# Patient Record
Sex: Female | Born: 1990 | Race: White | Hispanic: No | Marital: Single | Smoking: Former smoker
Health system: Southern US, Community
[De-identification: ages and names within clinical notes are randomized; demographics above are authoritative.]

## PROBLEM LIST (undated history)

## (undated) ENCOUNTER — Inpatient Hospital Stay (HOSPITAL_COMMUNITY): Payer: Self-pay

## (undated) DIAGNOSIS — B009 Herpesviral infection, unspecified: Secondary | ICD-10-CM

## (undated) DIAGNOSIS — T7840XA Allergy, unspecified, initial encounter: Secondary | ICD-10-CM

## (undated) DIAGNOSIS — N83209 Unspecified ovarian cyst, unspecified side: Secondary | ICD-10-CM

## (undated) DIAGNOSIS — G43909 Migraine, unspecified, not intractable, without status migrainosus: Secondary | ICD-10-CM

## (undated) DIAGNOSIS — J302 Other seasonal allergic rhinitis: Secondary | ICD-10-CM

## (undated) DIAGNOSIS — B019 Varicella without complication: Secondary | ICD-10-CM

## (undated) DIAGNOSIS — N390 Urinary tract infection, site not specified: Secondary | ICD-10-CM

## (undated) DIAGNOSIS — D649 Anemia, unspecified: Secondary | ICD-10-CM

## (undated) DIAGNOSIS — F419 Anxiety disorder, unspecified: Secondary | ICD-10-CM

## (undated) HISTORY — DX: Migraine, unspecified, not intractable, without status migrainosus: G43.909

## (undated) HISTORY — DX: Other seasonal allergic rhinitis: J30.2

## (undated) HISTORY — PX: HERNIA REPAIR: SHX51

## (undated) HISTORY — DX: Allergy, unspecified, initial encounter: T78.40XA

## (undated) HISTORY — DX: Varicella without complication: B01.9

## (undated) HISTORY — DX: Anxiety disorder, unspecified: F41.9

## (undated) HISTORY — DX: Urinary tract infection, site not specified: N39.0

## (undated) HISTORY — DX: Herpesviral infection, unspecified: B00.9

## (undated) HISTORY — DX: Anemia, unspecified: D64.9

---
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
## 2013-06-02 ENCOUNTER — Ambulatory Visit (INDEPENDENT_AMBULATORY_CARE_PROVIDER_SITE_OTHER): Payer: BC Managed Care – PPO | Admitting: Family Medicine

## 2013-06-02 VITALS — BP 110/80 | HR 97 | Temp 98.7°F | Resp 20 | Ht 64.0 in | Wt 154.0 lb

## 2013-06-02 DIAGNOSIS — N912 Amenorrhea, unspecified: Secondary | ICD-10-CM

## 2013-06-02 DIAGNOSIS — R21 Rash and other nonspecific skin eruption: Secondary | ICD-10-CM

## 2013-06-02 DIAGNOSIS — R1011 Right upper quadrant pain: Secondary | ICD-10-CM

## 2013-06-02 DIAGNOSIS — N760 Acute vaginitis: Secondary | ICD-10-CM

## 2013-06-02 DIAGNOSIS — B9689 Other specified bacterial agents as the cause of diseases classified elsewhere: Secondary | ICD-10-CM

## 2013-06-02 DIAGNOSIS — N898 Other specified noninflammatory disorders of vagina: Secondary | ICD-10-CM

## 2013-06-02 DIAGNOSIS — B360 Pityriasis versicolor: Secondary | ICD-10-CM

## 2013-06-02 DIAGNOSIS — R197 Diarrhea, unspecified: Secondary | ICD-10-CM

## 2013-06-02 LAB — POCT URINALYSIS DIPSTICK
Glucose, UA: NEGATIVE
Ketones, UA: 80
Leukocytes, UA: NEGATIVE
Nitrite, UA: NEGATIVE
Protein, UA: 30
Spec Grav, UA: 1.025
Urobilinogen, UA: 0.2
pH, UA: 6.5

## 2013-06-02 LAB — POCT UA - MICROSCOPIC ONLY
Casts, Ur, LPF, POC: NEGATIVE
Crystals, Ur, HPF, POC: NEGATIVE
Yeast, UA: NEGATIVE

## 2013-06-02 LAB — POCT WET PREP WITH KOH

## 2013-06-02 LAB — COMPREHENSIVE METABOLIC PANEL
ALT: 15 U/L (ref 0–35)
Alkaline Phosphatase: 45 U/L (ref 39–117)
Glucose, Bld: 82 mg/dL (ref 70–99)
Sodium: 139 mEq/L (ref 135–145)
Total Bilirubin: 0.7 mg/dL (ref 0.3–1.2)
Total Protein: 7.1 g/dL (ref 6.0–8.3)

## 2013-06-02 LAB — POCT CBC
Lymph, poc: 1.7 (ref 0.6–3.4)
MCHC: 31.6 g/dL — AB (ref 31.8–35.4)
MPV: 9.6 fL (ref 0–99.8)
POC Granulocyte: 5.3 (ref 2–6.9)
POC LYMPH PERCENT: 23.2 %L (ref 10–50)
POC MID %: 4.6 %M (ref 0–12)
RDW, POC: 12.3 %

## 2013-06-02 LAB — POCT SKIN KOH: Skin KOH, POC: POSITIVE

## 2013-06-02 MED ORDER — KETOCONAZOLE 2 % EX CREA: TOPICAL_CREAM | Freq: Two times a day (BID) | CUTANEOUS | Status: DC

## 2013-06-02 MED ORDER — METRONIDAZOLE 500 MG PO TABS: 500.0000 mg | ORAL_TABLET | Freq: Two times a day (BID) | ORAL | Status: DC

## 2013-06-02 MED ORDER — ONDANSETRON 8 MG PO TBDP: 8.0000 mg | ORAL_TABLET | Freq: Three times a day (TID) | ORAL | Status: DC | PRN

## 2013-06-02 MED ORDER — SELENIUM SULFIDE 2.5 % EX LOTN: TOPICAL_LOTION | Freq: Every day | CUTANEOUS | Status: DC

## 2013-06-02 NOTE — Patient Instructions (Signed)
Tinea Versicolor Tinea versicolor is a common yeast infection of the skin. This condition becomes known when the yeast on our skin starts to overgrow (yeast is a normal inhabitant on our skin). This condition is noticed as white or light brown patches on brown skin, and is more evident in the summer on tanned skin. These areas are slightly scaly if scratched. The light patches from the yeast become evident when the yeast creates "holes in your suntan". This is most often noticed in the summer. The patches are usually located on the chest, back, pubis, neck and body folds. However, it may occur on any area of body. Mild itching and inflammation (redness or soreness) may be present. DIAGNOSIS  The diagnosisof this is made clinically (by looking). Cultures from samples are usually not needed. Examination under the microscope may help. However, yeast is normally found on skin. The diagnosis still remains clinical. Examination under Wood's Ultraviolet Light can determine the extent of the infection. TREATMENT  This common infection is usually only of cosmetic (only a concern to your appearance). It is easily treated with dandruff shampoo used during showers or bathing. Vigorous scrubbing will eliminate the yeast over several days time. The light areas in your skin may remain for weeks or months after the infection is cured unless your skin is exposed to sunlight. The lighter or darker spots caused by the fungus that remain after complete treatment are not a sign of treatment failure; it will take a long time to resolve. Your caregiver may recommend a number of commercial preparations or medication by mouth if home care is not working. Recurrence is common and preventative medication may be necessary. This skin condition is not highly contagious. Special care is not needed to protect close friends and family members. Normal hygiene is usually enough. Follow up is required only if you develop complications (such as a  secondary infection from scratching), if recommended by your caregiver, or if no relief is obtained from the preparations used. Document Released: 07/21/2000 Document Revised: 10/16/2011 Document Reviewed: 09/02/2008 [facility] Patient Information 2014 [place], [place].  Bacterial Vaginosis Bacterial vaginosis (BV) is a vaginal infection where the normal balance of bacteria in the vagina is disrupted. The normal balance is then replaced by an overgrowth of certain bacteria. There are several different kinds of bacteria that can cause BV. BV is the most common vaginal infection in women of childbearing age. CAUSES   The cause of BV is not fully understood. BV develops when there is an increase or imbalance of harmful bacteria.  Some activities or behaviors can upset the normal balance of bacteria in the vagina and put women at increased risk including:  Having a new sex partner or multiple sex partners.  Douching.  Using an intrauterine device (IUD) for contraception.  It is not clear what role sexual activity plays in the development of BV. However, women that have never had sexual intercourse are rarely infected with BV. Women do not get BV from toilet seats, bedding, swimming pools or from touching objects around them.  SYMPTOMS   Grey vaginal discharge.  A fish-like odor with discharge, especially after sexual intercourse.  Itching or burning of the vagina and vulva.  Burning or pain with urination.  Some women have no signs or symptoms at all. DIAGNOSIS  Your caregiver must examine the vagina for signs of BV. Your caregiver will perform lab tests and look at the sample of vaginal fluid through a microscope. They will look for bacteria and  abnormal cells (clue cells), a pH test higher than 4.5, and a positive amine test all associated with BV.  RISKS AND COMPLICATIONS   Pelvic inflammatory disease (PID).  Infections following gynecology surgery.  Developing HIV.  Developing  herpes virus. TREATMENT  Sometimes BV will clear up without treatment. However, all women with symptoms of BV should be treated to avoid complications, especially if gynecology surgery is planned. Female partners generally do not need to be treated. However, BV may spread between female sex partners so treatment is helpful in preventing a recurrence of BV.   BV may be treated with antibiotics. The antibiotics come in either pill or vaginal cream forms. Either can be used with nonpregnant or pregnant women, but the recommended dosages differ. These antibiotics are not harmful to the baby.  BV can recur after treatment. If this happens, a second round of antibiotics will often be prescribed.  Treatment is important for pregnant women. If not treated, BV can cause a premature delivery, especially for a pregnant woman who had a premature birth in the past. All pregnant women who have symptoms of BV should be checked and treated.  For chronic reoccurrence of BV, treatment with a type of prescribed gel vaginally twice a week is helpful. HOME CARE INSTRUCTIONS   Finish all medication as directed by your caregiver.  Do not have sex until treatment is completed.  Tell your sexual partner that you have a vaginal infection. They should see their caregiver and be treated if they have problems, such as a mild rash or itching.  Practice safe sex. Use condoms. Only have 1 sex partner. PREVENTION  Basic prevention steps can help reduce the risk of upsetting the natural balance of bacteria in the vagina and developing BV:  Do not have sexual intercourse (be abstinent).  Do not douche.  Use all of the medicine prescribed for treatment of BV, even if the signs and symptoms go away.  Tell your sex partner if you have BV. That way, they can be treated, if needed, to prevent reoccurrence. SEEK MEDICAL CARE IF:   Your symptoms are not improving after 3 days of treatment.  You have increased discharge, pain,  or fever. MAKE SURE YOU:   Understand these instructions.  Will watch your condition.  Will get help right away if you are not doing well or get worse. FOR MORE INFORMATION  Division of STD Prevention (DSTDP), Centers for Disease Control and Prevention: [employer]Apps.[employer] American Social Health Association (ASHA): www.ashastd.org  Document Released: 07/24/2005 Document Revised: 10/16/2011 Document Reviewed: 01/14/2009 [facility] Patient Information 2014 [place], [place].

## 2013-06-02 NOTE — Progress Notes (Signed)
Subjective:    Patient ID: Barbara Vargas, female    DOB: Jan 18, 1991, 22 y.o.   MRN: [id number]  Chief Complaint  Patient presents with  . Rash    chin and neck, started 2 weeks ago  . Diarrhea    with nausea and vomiting but pt denies any stomach pains, started yesterday, 2 episodes  . Vaginal Discharge   This chart was scribed for Norberto Sorenson, MD by Blanchard Kelch, ED Scribe. The patient was seen in room 9. Patient's care was started at 2:52 PM.   HPI  Barbara Vargas is a 22 y.o. female who presents to office complaining of an intermittent rash on her neck and upper chest that began about two weeks ago. It is scaly and a little itchy but seems to be spreading.  She believes that she may have a vaginal infection. She is also getting watery vaginal discharge. It has a foul odor that she described as smelling like "ammonia". She denies pelvic pain. She is sexually active. She uses condoms for birth control. She has been on oral contraceptives in the past but she ran out and never had it refilled. She is not interested in restarting on oral contraceptives at this time. She states that she would be ok if she became pregnant.  She had STD testing done recently and declines to have it repeated today.  She is also complaining of vomiting and diarrhea that began two days ago. She has been able to keep water down. The diarrhea is watery. There is no black or bloody diarrhea. The vomiting is just what she ate and is less frequent than the diarrhea.  She hasn't noticed a pattern to the diarrhea, such as after eating. There is no blood, bile or coffee ground looking vomit present, no melena or hematochezia.   She has mild abdominal pain due to the vomiting and diarrhea. She has been getting chills and dizziness with the other symptoms. She has not been using any OTC medication for the symptoms such as Advil, Tylenol, Pepto Bismol or Imodium. She denies suspicious food intake, recent travel, recent  illness or sick contacts with similar symptoms. She doesn't usually get indigestion or heart burn. She denies any alcohol use. She denies fever, back pain or changes in her urine.  She states that her mother has a past medical history of gall bladder problems.  Past Medical History  Diagnosis Date  . Anxiety   . Allergy    No current outpatient prescriptions on file prior to visit.   No current facility-administered medications on file prior to visit.   No Known Allergies   Review of Systems  Constitutional: Positive for chills. Negative for fever.  HENT: Negative for congestion and ear pain.   Eyes: Negative for pain.  Respiratory: Negative for cough and shortness of breath.   Cardiovascular: Negative for chest pain.  Gastrointestinal: Positive for nausea, vomiting, abdominal pain and diarrhea.  Genitourinary: Positive for vaginal discharge. Negative for dysuria, frequency, hematuria and pelvic pain.  Musculoskeletal: Negative for back pain and gait problem.  Skin: Positive for rash.  Neurological: Positive for dizziness. Negative for speech difficulty.  Psychiatric/Behavioral: Negative for confusion.      BP 110/80  Pulse 97  Temp(Src) 98.7 F (37.1 C) (Oral)  Resp 20  Ht 5\' 4"  (1.626 m)  Wt 154 lb (69.854 kg)  BMI 26.42 kg/m2  SpO2 99%  LMP 05/28/2013 Objective:   Physical Exam  Nursing note and vitals reviewed. Constitutional: She is  oriented to person, place, and time. She appears well-developed and well-nourished. No distress.  HENT:  Head: Normocephalic and atraumatic.  Eyes: EOM are normal.  Neck: Neck supple. No tracheal deviation present.  Cardiovascular: Normal rate, regular rhythm and normal heart sounds.   No murmur heard. Pulmonary/Chest: Effort normal and breath sounds normal. No respiratory distress. She has no wheezes. She has no rales.  Abdominal: Soft. Bowel sounds are normal. She exhibits no distension. There is tenderness in the right upper  quadrant. There is positive Murphy's sign.  Genitourinary: Uterus normal. There is no rash, tenderness or lesion on the right labia. There is no rash, tenderness or lesion on the left labia. Cervix exhibits discharge. Cervix exhibits no motion tenderness and no friability. Right adnexum displays no mass, no tenderness and no fullness. Left adnexum displays no mass, no tenderness and no fullness. No erythema, tenderness or bleeding around the vagina. Vaginal discharge found.  Moderate amount of white/brown thick discharge.  Musculoskeletal: Normal range of motion.  Neurological: She is alert and oriented to person, place, and time.  Skin: Skin is warm and dry. Rash noted.  Macular hypopigmented serpiginous rash with fine scale on left neck and left upper chest.   Psychiatric: She has a normal mood and affect. Her behavior is normal.   Results for orders placed in visit on 06/02/13  POCT URINALYSIS DIPSTICK      Result Value Range   Color, UA yellow     Clarity, UA cloudy     Glucose, UA neg     Bilirubin, UA small     Ketones, UA 80     Spec Grav, UA 1.025     Blood, UA moderate     pH, UA 6.5     Protein, UA 30     Urobilinogen, UA 0.2     Nitrite, UA neg     Leukocytes, UA Negative    POCT UA - MICROSCOPIC ONLY      Result Value Range   WBC, Ur, HPF, POC 1-5     RBC, urine, microscopic 0-1     Bacteria, U Microscopic 3+     Mucus, UA large     Epithelial cells, urine per micros tntc     Crystals, Ur, HPF, POC neg     Casts, Ur, LPF, POC neg     Yeast, UA neg    POCT SKIN KOH      Result Value Range   Skin KOH, POC Positive    POCT WET PREP WITH KOH      Result Value Range   Trichomonas, UA Negative     Clue Cells Wet Prep HPF POC 5-10     Epithelial Wet Prep HPF POC 5-15     Yeast Wet Prep HPF POC neg     Bacteria Wet Prep HPF POC 3+     RBC Wet Prep HPF POC 0-1     WBC Wet Prep HPF POC 15-tntc     KOH Prep POC Negative    POCT CBC      Result Value Range   WBC 7.4   4.6 - 10.2 K/uL   Lymph, poc 1.7  0.6 - 3.4   POC LYMPH PERCENT 23.2  10 - 50 %L   MID (cbc) 0.3  0 - 0.9   POC MID % 4.6  0 - 12 %M   POC Granulocyte 5.3  2 - 6.9   Granulocyte percent 72.2  37 - 80 %G  RBC 4.49  4.04 - 5.48 M/uL   Hemoglobin 13.5  12.2 - 16.2 g/dL   HCT, POC 16.1  09.6 - 47.9 %   MCV 95.0  80 - 97 fL   MCH, POC 30.1  27 - 31.2 pg   MCHC 31.6 (*) 31.8 - 35.4 g/dL   RDW, POC 04.5     Platelet Count, POC 245  142 - 424 K/uL   MPV 9.6  0 - 99.8 fL  POCT URINE PREGNANCY      Result Value Range   Preg Test, Ur Negative          Assessment & Plan:  3:42 PM- Discussed lab results and discharge plan. Will prescribe Flagyl, Zofran and topical ointment for yeast infection on chest and neck. Patient verbalizes understanding and agrees with treatment plan.  Vaginal discharge - Plan: POCT urinalysis dipstick, POCT UA - Microscopic Only, POCT Wet Prep with KOH  Rash - Plan: POCT Skin KOH  Diarrhea - Plan: POCT CBC, Comprehensive metabolic panel, US Abdomen Limited RUQ  Amenorrhea - Plan: POCT urine pregnancy  Bacterial vaginosis - flagyl  Tinea versicolor  Abdominal pain, right upper quadrant - Plan: US Abdomen Limited RUQ  Pt may have a self-limited gastroenteritis as a cause of her abd pain, n/v/d but due to increased tenderness over liver will order asap RUQ [place] to r/o gallstones. Reassured that acute cholecysitis unlikely due to nml CBC and non-toxic appearance on exam. Try prn zofran and push fluids.  Meds ordered this encounter  Medications  . metroNIDAZOLE (FLAGYL) 500 MG tablet    Sig: Take 1 tablet (500 mg total) by mouth 2 (two) times daily.    Dispense:  14 tablet    Refill:  0  . ketoconazole (NIZORAL) 2 % cream    Sig: Apply topically 2 (two) times daily.    Dispense:  60 g    Refill:  0  . selenium sulfide (SELSUN) 2.5 % shampoo    Sig: Apply topically daily.    Dispense:  120 mL    Refill:  0  . ondansetron (ZOFRAN-ODT) 8 MG disintegrating  tablet    Sig: Take 1 tablet (8 mg total) by mouth every 8 (eight) hours as needed for nausea.    Dispense:  30 tablet    Refill:  0    I personally performed the services described in this documentation, which was scribed in my presence. The recorded information has been reviewed and considered, and addended by me as needed.  Norberto Sorenson, MD MPH

## 2013-06-03 ENCOUNTER — Other Ambulatory Visit: Payer: Self-pay

## 2013-06-04 ENCOUNTER — Encounter: Payer: Self-pay | Admitting: Family Medicine

## 2013-07-11 ENCOUNTER — Ambulatory Visit
Admission: RE | Admit: 2013-07-11 | Discharge: 2013-07-11 | Disposition: A | Discharge status: Home / Self Care | Payer: BC Managed Care – PPO | Source: Ambulatory Visit | Attending: Family Medicine | Admitting: Family Medicine

## 2013-07-11 ENCOUNTER — Ambulatory Visit: Payer: BC Managed Care – PPO

## 2013-07-11 ENCOUNTER — Ambulatory Visit (INDEPENDENT_AMBULATORY_CARE_PROVIDER_SITE_OTHER): Payer: BC Managed Care – PPO | Admitting: Family Medicine

## 2013-07-11 VITALS — BP 124/74 | HR 76 | Temp 98.8°F | Resp 17 | Ht 64.0 in | Wt 158.0 lb

## 2013-07-11 DIAGNOSIS — R109 Unspecified abdominal pain: Secondary | ICD-10-CM

## 2013-07-11 DIAGNOSIS — R1011 Right upper quadrant pain: Secondary | ICD-10-CM

## 2013-07-11 DIAGNOSIS — N912 Amenorrhea, unspecified: Secondary | ICD-10-CM

## 2013-07-11 DIAGNOSIS — N390 Urinary tract infection, site not specified: Secondary | ICD-10-CM

## 2013-07-11 DIAGNOSIS — R197 Diarrhea, unspecified: Secondary | ICD-10-CM

## 2013-07-11 LAB — POCT CBC
HCT, POC: 44.6 % (ref 37.7–47.9)
Hemoglobin: 13.9 g/dL (ref 12.2–16.2)
Lymph, poc: 1.7 (ref 0.6–3.4)
MCH, POC: 29.6 pg (ref 27–31.2)
MCHC: 31.2 g/dL — AB (ref 31.8–35.4)
MCV: 95 fL (ref 80–97)
POC MID %: 5.8 %M (ref 0–12)
WBC: 5.5 10*3/uL (ref 4.6–10.2)

## 2013-07-11 LAB — POCT UA - MICROSCOPIC ONLY
Casts, Ur, LPF, POC: NEGATIVE
Mucus, UA: NEGATIVE

## 2013-07-11 LAB — POCT URINALYSIS DIPSTICK
Glucose, UA: NEGATIVE
Ketones, UA: NEGATIVE
Spec Grav, UA: 1.025
Urobilinogen, UA: 0.2

## 2013-07-11 LAB — POCT URINE PREGNANCY: Preg Test, Ur: NEGATIVE

## 2013-07-11 MED ORDER — NITROFURANTOIN MONOHYD MACRO 100 MG PO CAPS: 100.0000 mg | ORAL_CAPSULE | Freq: Two times a day (BID) | ORAL | Status: DC

## 2013-07-11 NOTE — Addendum Note (Signed)
Addended by: Eulah Pont on: 07/11/2013 11:43 AM   Modules accepted: Orders

## 2013-07-11 NOTE — Patient Instructions (Signed)
Drink plenty of fluids  Take MiraLax one dose daily until stools are loose, then drop back to just using a couple of times a week or as needed.  Take the antibiotic twice daily for the bladder infection  Tylenol or ibuprofen for pain  In the event of acutely worse pain come back for immediate recheck or go to the emergency room  If not improving over the next week or 2 after bowels are cleaned out return for further assessment

## 2013-07-11 NOTE — Progress Notes (Signed)
Subjective: 22 year old patient who was here about 6 weeks ago for several things, and was diagnosed with BV and a fungus on her chest wall. The fungus is gradually clearing up using some cream on it. She took metronidazole for the BV and that extend better. She's continued though with hurting in her low abdomen. He is just a soreness down there. She also has had a persistent nausea with some vomiting. Her last missed period was on October 31, so she is a few days late. They do use condoms consistently for contraception. She's not been running any fevers. Her bowels act regularly and although she previously had diarrhea she says they are soft but not diarrhea now.  Objective: Well-developed young lady in no major distress. Abdomen has active bowel sounds. Soft without masses. Mild nonspecific low abdominal tenderness. Review her abdominal ultrasound report which was done this morning. It looks negative. No CVA tenderness  Assessment Nausea and vomiting Low abdominal pain Late onset of menses  Plan: CBC, UA, urine hCG, KUB  Results for orders placed in visit on 07/11/13  POCT URINE PREGNANCY      Result Value Range   Preg Test, Ur Negative    POCT UA - MICROSCOPIC ONLY      Result Value Range   WBC, Ur, HPF, POC 4-8     RBC, urine, microscopic 0-1     Bacteria, U Microscopic 4+     Mucus, UA neg     Epithelial cells, urine per micros 7-12     Crystals, Ur, HPF, POC neg     Casts, Ur, LPF, POC neg     Yeast, UA neg    POCT URINALYSIS DIPSTICK      Result Value Range   Color, UA yellow     Clarity, UA clear     Glucose, UA neg     Bilirubin, UA neg     Ketones, UA neg     Spec Grav, UA 1.025     Blood, UA trace     pH, UA 6.5     Protein, UA neg     Urobilinogen, UA 0.2     Nitrite, UA neg     Leukocytes, UA Negative      [facility] reading (PRIMARY) by  Dr. Alwyn Ren Normal except for stool/gas  Has minimal pyuria, and will treat for a mild UTI. I do not believe this is because of  all of her discomfort. She does have a moderate amount of stool backed up in her, and this may well be the cause of nausea and pain. We will try some MiraLax first if symptoms continue to persist, will schedule for further testing and/or referral. May need a pelvic ultrasound, repeat pelvic exam, and GI referral..

## 2013-07-12 LAB — URINE CULTURE

## 2014-01-25 ENCOUNTER — Emergency Department (HOSPITAL_COMMUNITY)
Admission: EM | Admit: 2014-01-25 | Discharge: 2014-01-25 | Disposition: A | Discharge status: Home / Self Care | Payer: BC Managed Care – PPO | Attending: Emergency Medicine | Admitting: Emergency Medicine

## 2014-01-25 ENCOUNTER — Encounter (HOSPITAL_COMMUNITY): Payer: Self-pay | Admitting: Emergency Medicine

## 2014-01-25 DIAGNOSIS — O9989 Other specified diseases and conditions complicating pregnancy, childbirth and the puerperium: Secondary | ICD-10-CM | POA: Insufficient documentation

## 2014-01-25 DIAGNOSIS — R5381 Other malaise: Secondary | ICD-10-CM | POA: Insufficient documentation

## 2014-01-25 DIAGNOSIS — O210 Mild hyperemesis gravidarum: Secondary | ICD-10-CM | POA: Insufficient documentation

## 2014-01-25 DIAGNOSIS — R5383 Other fatigue: Secondary | ICD-10-CM

## 2014-01-25 DIAGNOSIS — N898 Other specified noninflammatory disorders of vagina: Secondary | ICD-10-CM | POA: Insufficient documentation

## 2014-01-25 DIAGNOSIS — R630 Anorexia: Secondary | ICD-10-CM | POA: Insufficient documentation

## 2014-01-25 DIAGNOSIS — Z8659 Personal history of other mental and behavioral disorders: Secondary | ICD-10-CM | POA: Insufficient documentation

## 2014-01-25 DIAGNOSIS — Z87891 Personal history of nicotine dependence: Secondary | ICD-10-CM | POA: Insufficient documentation

## 2014-01-25 DIAGNOSIS — R011 Cardiac murmur, unspecified: Secondary | ICD-10-CM | POA: Insufficient documentation

## 2014-01-25 DIAGNOSIS — Z3490 Encounter for supervision of normal pregnancy, unspecified, unspecified trimester: Secondary | ICD-10-CM

## 2014-01-25 DIAGNOSIS — R1110 Vomiting, unspecified: Secondary | ICD-10-CM

## 2014-01-25 LAB — URINALYSIS, ROUTINE W REFLEX MICROSCOPIC
Bilirubin Urine: NEGATIVE
Glucose, UA: NEGATIVE mg/dL
Hgb urine dipstick: NEGATIVE
KETONES UR: NEGATIVE mg/dL
LEUKOCYTES UA: NEGATIVE
NITRITE: NEGATIVE
PROTEIN: NEGATIVE mg/dL
Specific Gravity, Urine: 1.026 (ref 1.005–1.030)
UROBILINOGEN UA: 0.2 mg/dL (ref 0.0–1.0)
pH: 6 (ref 5.0–8.0)

## 2014-01-25 LAB — COMPREHENSIVE METABOLIC PANEL
ALK PHOS: 61 U/L (ref 39–117)
ALT: 13 U/L (ref 0–35)
AST: 18 U/L (ref 0–37)
Albumin: 4.3 g/dL (ref 3.5–5.2)
BUN: 13 mg/dL (ref 6–23)
CALCIUM: 9.4 mg/dL (ref 8.4–10.5)
CO2: 22 meq/L (ref 19–32)
Chloride: 103 mEq/L (ref 96–112)
Creatinine, Ser: 0.62 mg/dL (ref 0.50–1.10)
GLUCOSE: 80 mg/dL (ref 70–99)
POTASSIUM: 3.8 meq/L (ref 3.7–5.3)
SODIUM: 138 meq/L (ref 137–147)
Total Bilirubin: 0.6 mg/dL (ref 0.3–1.2)
Total Protein: 7.6 g/dL (ref 6.0–8.3)

## 2014-01-25 LAB — CBC WITH DIFFERENTIAL/PLATELET
Basophils Absolute: 0 10*3/uL (ref 0.0–0.1)
Basophils Relative: 0 % (ref 0–1)
EOS PCT: 2 % (ref 0–5)
Eosinophils Absolute: 0.2 10*3/uL (ref 0.0–0.7)
HCT: 39.3 % (ref 36.0–46.0)
HEMOGLOBIN: 13.4 g/dL (ref 12.0–15.0)
LYMPHS ABS: 2.1 10*3/uL (ref 0.7–4.0)
LYMPHS PCT: 25 % (ref 12–46)
MCH: 30 pg (ref 26.0–34.0)
MCHC: 34.1 g/dL (ref 30.0–36.0)
MCV: 88.1 fL (ref 78.0–100.0)
MONOS PCT: 8 % (ref 3–12)
Monocytes Absolute: 0.7 10*3/uL (ref 0.1–1.0)
Neutro Abs: 5.4 10*3/uL (ref 1.7–7.7)
Neutrophils Relative %: 65 % (ref 43–77)
PLATELETS: 222 10*3/uL (ref 150–400)
RBC: 4.46 MIL/uL (ref 3.87–5.11)
RDW: 12.1 % (ref 11.5–15.5)
WBC: 8.4 10*3/uL (ref 4.0–10.5)

## 2014-01-25 LAB — WET PREP, GENITAL
Clue Cells Wet Prep HPF POC: NONE SEEN
Trich, Wet Prep: NONE SEEN
WBC WET PREP: NONE SEEN
Yeast Wet Prep HPF POC: NONE SEEN

## 2014-01-25 LAB — PREGNANCY, URINE: PREG TEST UR: POSITIVE — AB

## 2014-01-25 LAB — LIPASE, BLOOD: Lipase: 23 U/L (ref 11–59)

## 2014-01-25 MED ORDER — ONDANSETRON 4 MG PO TBDP: 4.0000 mg | ORAL_TABLET | Freq: Three times a day (TID) | ORAL | Status: DC | PRN

## 2014-01-25 MED ORDER — ONDANSETRON HCL 4 MG/2ML IJ SOLN
4.0000 mg | Freq: Once | INTRAMUSCULAR | Status: AC
  Administered 2014-01-25: 4 mg via INTRAVENOUS
  Filled 2014-01-25: qty 2

## 2014-01-25 MED ORDER — SODIUM CHLORIDE 0.9 % IV BOLUS (SEPSIS)
1000.0000 mL | Freq: Once | INTRAVENOUS | Status: AC
  Administered 2014-01-25: 1000 mL via INTRAVENOUS

## 2014-01-25 NOTE — Discharge Instructions (Signed)
Zofran as needed for nausea and vomiting - as directed  Return to the emergency department if you develop any changing/worsening condition, abdominal pain, vaginal bleeding or any other concerns (please read additional information regarding your condition below)    Pregnancy If you are planning on getting pregnant, it is a good idea to make a preconception appointment with your health care provider to discuss having a healthy lifestyle before getting pregnant. This includes diet, weight, exercise, taking prenatal vitamins (especially folic acid, which helps prevent brain and spinal cord defects), avoiding alcohol, quitting smoking and illegal drugs, discussing medical problems (diabetes, heart disease, convulsions) and family history of genetic problems, your working conditions, and your immunizations. It is better to have knowledge of these things and do something about them before getting pregnant.  During your pregnancy, it is important to follow certain guidelines in order to have a healthy baby. It is very important to get good prenatal care and follow your health care provider's instructions. Prenatal care includes all the medical care you receive before your baby's birth. This helps to prevent problems during the pregnancy and childbirth.  HOME CARE INSTRUCTIONS   Start your prenatal visits by the 12th week of pregnancy or earlier, if possible. At first, appointments are usually scheduled monthly. They become more frequent in the last 2 months before delivery. It is important that you keep your health care provider's appointments and follow his or her instructions regarding medicine use, exercise, and diet.  During pregnancy, you are providing food for you and your baby. Eat a regular, well-balanced diet. Choose foods such as meat, fish, milk and other dairy products, vegetables, fruits, and whole-grain breads and cereals. Your health care provider will inform you of your ideal weight gain during  pregnancy depending on your current height and weight. Drink plenty of fluids. Try to drink 8 glasses of water a day.  Alcohol is related to a number of birth defects, including fetal alcohol syndrome. It is best to avoid alcohol completely. Smoking will cause low birth rate and prematurity. Use of alcohol and nicotine during your pregnancy also increases the chances that your child will be chemically dependent later in life and may contribute to sudden infant death syndrome, or SIDS.  Do not use illegal drugs.  Only take medicines as directed by your health care provider. Some medicines can cause genetic and physical problems in your baby.  Morning sickness can often be helped by keeping soda crackers at the bedside. Eat a few crackers before getting up in the morning.  A sexual relationship may be continued until near the end of your pregnancy if there are no other problems such as early (premature) leaking of amniotic fluid from the membranes, vaginal bleeding, painful intercourse, or abdominal pain.  Exercise regularly. Check with your health care provider if you are unsure about whether your exercises are safe.  Do not use hot tubs, steam rooms, or saunas. These increase the risk of fainting and you hurting yourself and your baby. Swimming is okay for exercise. Get plenty of rest, including afternoon naps when possible, especially in the third trimester.  Avoid toxic odors and chemicals.  Do not wear high heels. They may cause you to lose your balance and fall.  Do not lift over 5 pounds (2.3 kg). If you do lift anything, lift with your legs and thighs, not your back.  Avoid traveling, especially in the third trimester. If you have to travel out of the city or state, take  a copy of your medical records with you.  Learn about, and consider, breastfeeding your baby. SEEK IMMEDIATE MEDICAL CARE IF:   You have a fever.  You have leaking of fluid from your vagina. If you think your water  broke, take your temperature and tell your health care provider of this when you call.  You have vaginal spotting or bleeding. Tell your health care provider of the amount and how many pads are used.  You continue to feel nauseous and have no relief from remedies suggested, or you vomit blood or coffee ground-like materials.  You have upper abdominal pain.  You have round ligament discomfort in the lower abdominal area. This still must be evaluated by your health care provider.  You feel contractions.  You do not feel the baby move, or there is less movement than before.  You have painful urination.  You have abnormal vaginal discharge.  You have persistent diarrhea.  You have a severe headache.  You have problems with your vision.  You have muscle weakness.  You feel dizzy and faint.  You have shortness of breath.  You have chest pain.  You have back pain that travels down to your legs and feet.  You feel your heart is beating fast or not regular, like it skips a beat.  You gain a lot of weight in a short period of time (5 pounds in 3-5 days).  You are involved in a domestic violence situation. Document Released: 07/24/2005 Document Revised: 07/29/2013 Document Reviewed: 01/15/2009 [facility] Patient Information 2015 [place], [place]. This information is not intended to replace advice given to you by your health care provider. Make sure you discuss any questions you have with your health care provider.  Hyperemesis Gravidarum Hyperemesis gravidarum is a severe form of nausea and vomiting that happens during pregnancy. Hyperemesis is worse than morning sickness. It may cause you to have nausea or vomiting all day for many days. It may keep you from eating and drinking enough food and liquids. Hyperemesis usually occurs during the first half (the first 20 weeks) of pregnancy. It often goes away once a woman is in her second half of pregnancy. However, sometimes hyperemesis  continues through an entire pregnancy.  CAUSES  The cause of this condition is not completely known but is thought to be related to changes in the body's hormones when pregnant. It could be from the high level of the pregnancy hormone or an increase in estrogen in the body.  SIGNS AND SYMPTOMS   Severe nausea and vomiting.  Nausea that does not go away.  Vomiting that does not allow you to keep any food down.  Weight loss and body fluid loss (dehydration).  Having no desire to eat or not liking food you have previously enjoyed. DIAGNOSIS  Your health care provider will do a physical exam and ask you about your symptoms. He or she may also order blood tests and urine tests to make sure something else is not causing the problem.  TREATMENT  You may only need medicine to control the problem. If medicines do not control the nausea and vomiting, you will be treated in the hospital to prevent dehydration, increased acid in the blood (acidosis), weight loss, and changes in the electrolytes in your body that may harm the unborn baby (fetus). You may need IV fluids.  HOME CARE INSTRUCTIONS   Only take over-the-counter or prescription medicines as directed by your health care provider.  Try eating a couple of dry  crackers or toast in the morning before getting out of bed.  Avoid foods and smells that upset your stomach.  Avoid fatty and spicy foods.  Eat 5-6 small meals a day.  Do not drink when eating meals. Drink between meals.  For snacks, eat high-protein foods, such as cheese.  Eat or suck on things that have ginger in them. Ginger helps nausea.  Avoid food preparation. The smell of food can spoil your appetite.  Avoid iron pills and iron in your multivitamins until after 3-4 months of being pregnant. However, consult with your health care provider before stopping any prescribed iron pills. SEEK MEDICAL CARE IF:   Your abdominal pain increases.  You have a severe  headache.  You have vision problems.  You are losing weight. SEEK IMMEDIATE MEDICAL CARE IF:   You are unable to keep fluids down.  You vomit blood.  You have constant nausea and vomiting.  You have excessive weakness.  You have extreme thirst.  You have dizziness or fainting.  You have a fever or persistent symptoms for more than 2-3 days.  You have a fever and your symptoms suddenly get worse. MAKE SURE YOU:   Understand these instructions.  Will watch your condition.  Will get help right away if you are not doing well or get worse. Document Released: 07/24/2005 Document Revised: 05/14/2013 Document Reviewed: 03/05/2013 [facility] Patient Information 2015 [place]. This information is not intended to replace advice given to you by your health care provider. Make sure you discuss any questions you have with your health care provider.  Nausea and Vomiting Nausea is a sick feeling that often comes before throwing up (vomiting). Vomiting is a reflex where stomach contents come out of your mouth. Vomiting can cause severe loss of body fluids (dehydration). Children and elderly adults can become dehydrated quickly, especially if they also have diarrhea. Nausea and vomiting are symptoms of a condition or disease. It is important to find the cause of your symptoms. CAUSES   Direct irritation of the stomach lining. This irritation can result from increased acid production (gastroesophageal reflux disease), infection, food poisoning, taking certain medicines (such as nonsteroidal anti-inflammatory drugs), alcohol use, or tobacco use.  Signals from the brain.These signals could be caused by a headache, heat exposure, an inner ear disturbance, increased pressure in the brain from injury, infection, a tumor, or a concussion, pain, emotional stimulus, or metabolic problems.  An obstruction in the gastrointestinal tract (bowel obstruction).  Illnesses such as diabetes, hepatitis,  gallbladder problems, appendicitis, kidney problems, cancer, sepsis, atypical symptoms of a heart attack, or eating disorders.  Medical treatments such as chemotherapy and radiation.  Receiving medicine that makes you sleep (general anesthetic) during surgery. DIAGNOSIS Your caregiver may ask for tests to be done if the problems do not improve after a few days. Tests may also be done if symptoms are severe or if the reason for the nausea and vomiting is not clear. Tests may include:  Urine tests.  Blood tests.  Stool tests.  Cultures (to look for evidence of infection).  X-rays or other imaging studies. Test results can help your caregiver make decisions about treatment or the need for additional tests. TREATMENT You need to stay well hydrated. Drink frequently but in small amounts.You may wish to drink water, sports drinks, clear broth, or eat frozen ice pops or gelatin dessert to help stay hydrated.When you eat, eating slowly may help prevent nausea.There are also some antinausea medicines that may help prevent  nausea. HOME CARE INSTRUCTIONS   Take all medicine as directed by your caregiver.  If you do not have an appetite, do not force yourself to eat. However, you must continue to drink fluids.  If you have an appetite, eat a normal diet unless your caregiver tells you differently.  Eat a variety of complex carbohydrates (rice, wheat, potatoes, bread), lean meats, yogurt, fruits, and vegetables.  Avoid high-fat foods because they are more difficult to digest.  Drink enough water and fluids to keep your urine clear or pale yellow.  If you are dehydrated, ask your caregiver for specific rehydration instructions. Signs of dehydration may include:  Severe thirst.  Dry lips and mouth.  Dizziness.  Dark urine.  Decreasing urine frequency and amount.  Confusion.  Rapid breathing or pulse. SEEK IMMEDIATE MEDICAL CARE IF:   You have blood or brown flecks (like coffee  grounds) in your vomit.  You have black or bloody stools.  You have a severe headache or stiff neck.  You are confused.  You have severe abdominal pain.  You have chest pain or trouble breathing.  You do not urinate at least once every 8 hours.  You develop cold or clammy skin.  You continue to vomit for longer than 24 to 48 hours.  You have a fever. MAKE SURE YOU:   Understand these instructions.  Will watch your condition.  Will get help right away if you are not doing well or get worse. Document Released: 07/24/2005 Document Revised: 10/16/2011 Document Reviewed: 12/21/2010 [facility] Patient Information 2015 [place], [place]. This information is not intended to replace advice given to you by your health care provider. Make sure you discuss any questions you have with your health care provider.

## 2014-01-25 NOTE — ED Provider Notes (Signed)
CSN: [id number]     Arrival date & time 01/25/14  1721 History   First MD Initiated Contact with Patient 01/25/14 1810     Chief Complaint  Patient presents with  . Emesis  . Vaginal Discharge   HPI  Barbara Vargas is a 23 y.o. female with a PMH of allergies and anxiety who presents to the ED for evaluation of emesis and vaginal discharge. History was provided by the patient. Patient complains of a 3 days hx of constant nausea and vomiting. She has not been able to eat anything for the past 3 days. Also has had fatigue and generalized weakness. She denies any abdominal pain, diarrhea or constipation. She also complains of clear foul odor vaginal discharge for the past week. She denies any dysuria, vaginal bleeding, vaginal pain, pelvic pain, or genital sores. She is currently sexually active with no new partners. She is due for her menstrual period. Has breast tenderness. Does not use contraception. Denies any fever, chills, chest pain, SOB, headaches, dizziness, lightheadedness, or other concerns. No known sick contacts.    Past Medical History  Diagnosis Date  . Anxiety   . Allergy    Past Surgical History  Procedure Laterality Date  . Hernia repair     No family history on file. History  Substance Use Topics  . Smoking status: Former Smoker -- 0.50 packs/day for 1 years    Types: Cigarettes  . Smokeless tobacco: Not on file  . Alcohol Use: No   OB History   Grav Para Term Preterm Abortions TAB SAB Ect Mult Living                 Review of Systems  Constitutional: Positive for activity change, appetite change and fatigue. Negative for fever and chills.  HENT: Negative for congestion, rhinorrhea and sore throat.   Respiratory: Negative for cough and shortness of breath.   Cardiovascular: Negative for chest pain and leg swelling.  Gastrointestinal: Positive for nausea and vomiting. Negative for abdominal pain, diarrhea, constipation and blood in stool.  Genitourinary:  Positive for vaginal discharge. Negative for dysuria, frequency, hematuria, decreased urine volume, vaginal bleeding, difficulty urinating, genital sores, vaginal pain, menstrual problem and pelvic pain.  Musculoskeletal: Negative for back pain.  Skin: Negative for rash.  Neurological: Negative for dizziness, weakness, light-headedness and headaches.    Allergies  Review of patient's allergies indicates no known allergies.  Home Medications   Prior to Admission medications   Medication Sig Start Date End Date Taking? Authorizing Provider  ibuprofen (ADVIL,MOTRIN) 200 MG tablet Take 400 mg by mouth every 6 (six) hours as needed for moderate pain.   Yes Historical Provider, MD   BP 132/70  Pulse 99  Temp(Src) 98.3 F (36.8 C) (Oral)  Resp 20  SpO2 95%  LMP 12/24/2013  Filed Vitals:   01/25/14 1730 01/25/14 2030  BP: 132/70 109/67  Pulse: 99 86  Temp: 98.3 F (36.8 C)   TempSrc: Oral   Resp: 20 16  SpO2: 95% 100%    Physical Exam  Nursing note and vitals reviewed. Constitutional: She is oriented to person, place, and time. She appears well-developed and well-nourished. No distress.  Non-toxic  HENT:  Head: Normocephalic and atraumatic.  Right Ear: External ear normal.  Left Ear: External ear normal.  Nose: Nose normal.  Mouth/Throat: Oropharynx is clear and moist. No oropharyngeal exudate.  Eyes: Conjunctivae are normal. Right eye exhibits no discharge. Left eye exhibits no discharge.  Neck: Normal range of  motion. Neck supple.  Cardiovascular: Normal rate and regular rhythm.  Exam reveals no gallop and no friction rub.   Murmur heard. Grade 1 holosystolic murmur  Pulmonary/Chest: Effort normal and breath sounds normal. No respiratory distress. She has no wheezes. She has no rales. She exhibits no tenderness.  Abdominal: Soft. Bowel sounds are normal. She exhibits no distension and no mass. There is no tenderness. There is no rebound and no guarding.  Genitourinary:   Thin white minimal discharge present in the vaginal vault. No vaginal bleeding. No CMT or adnexal tenderness bilaterally. Os is closed.   Musculoskeletal: Normal range of motion. She exhibits no edema and no tenderness.  No CVA, lumbar, or flank tenderness bilaterally.   Neurological: She is alert and oriented to person, place, and time.  Skin: Skin is warm and dry. She is not diaphoretic.    ED Course  Procedures (including critical care time) Labs Review Labs Reviewed  WET PREP, GENITAL  GC/CHLAMYDIA PROBE AMP  PREGNANCY, URINE  URINALYSIS, ROUTINE W REFLEX MICROSCOPIC    Imaging Review No results found.   EKG Interpretation None      Results for orders placed during the hospital encounter of 01/25/14  WET PREP, GENITAL      Result Value Ref Range   Yeast Wet Prep HPF POC NONE SEEN  NONE SEEN   Trich, Wet Prep NONE SEEN  NONE SEEN   Clue Cells Wet Prep HPF POC NONE SEEN  NONE SEEN   WBC, Wet Prep HPF POC NONE SEEN  NONE SEEN  PREGNANCY, URINE      Result Value Ref Range   Preg Test, Ur POSITIVE (*) NEGATIVE  URINALYSIS, ROUTINE W REFLEX MICROSCOPIC      Result Value Ref Range   Color, Urine YELLOW  YELLOW   APPearance CLEAR  CLEAR   Specific Gravity, Urine 1.026  1.005 - 1.030   pH 6.0  5.0 - 8.0   Glucose, UA NEGATIVE  NEGATIVE mg/dL   Hgb urine dipstick NEGATIVE  NEGATIVE   Bilirubin Urine NEGATIVE  NEGATIVE   Ketones, ur NEGATIVE  NEGATIVE mg/dL   Protein, ur NEGATIVE  NEGATIVE mg/dL   Urobilinogen, UA 0.2  0.0 - 1.0 mg/dL   Nitrite NEGATIVE  NEGATIVE   Leukocytes, UA NEGATIVE  NEGATIVE  CBC WITH DIFFERENTIAL      Result Value Ref Range   WBC 8.4  4.0 - 10.5 K/uL   RBC 4.46  3.87 - 5.11 MIL/uL   Hemoglobin 13.4  12.0 - 15.0 g/dL   HCT 16.139.3  09.636.0 - 04.546.0 %   MCV 88.1  78.0 - 100.0 fL   MCH 30.0  26.0 - 34.0 pg   MCHC 34.1  30.0 - 36.0 g/dL   RDW 40.912.1  81.111.5 - 91.415.5 %   Platelets 222  150 - 400 K/uL   Neutrophils Relative % 65  43 - 77 %   Neutro Abs 5.4   1.7 - 7.7 K/uL   Lymphocytes Relative 25  12 - 46 %   Lymphs Abs 2.1  0.7 - 4.0 K/uL   Monocytes Relative 8  3 - 12 %   Monocytes Absolute 0.7  0.1 - 1.0 K/uL   Eosinophils Relative 2  0 - 5 %   Eosinophils Absolute 0.2  0.0 - 0.7 K/uL   Basophils Relative 0  0 - 1 %   Basophils Absolute 0.0  0.0 - 0.1 K/uL  COMPREHENSIVE METABOLIC PANEL      Result  Value Ref Range   Sodium 138  137 - 147 mEq/L   Potassium 3.8  3.7 - 5.3 mEq/L   Chloride 103  96 - 112 mEq/L   CO2 22  19 - 32 mEq/L   Glucose, Bld 80  70 - 99 mg/dL   BUN 13  6 - 23 mg/dL   Creatinine, Ser 1.61  0.50 - 1.10 mg/dL   Calcium 9.4  8.4 - 09.6 mg/dL   Total Protein 7.6  6.0 - 8.3 g/dL   Albumin 4.3  3.5 - 5.2 g/dL   AST 18  0 - 37 U/L   ALT 13  0 - 35 U/L   Alkaline Phosphatase 61  39 - 117 U/L   Total Bilirubin 0.6  0.3 - 1.2 mg/dL   GFR calc non Af Amer >90  >90 mL/min   GFR calc Af Amer >90  >90 mL/min  LIPASE, BLOOD      Result Value Ref Range   Lipase 23  11 - 59 U/L     MDM   Barbara Vargas is a 23 y.o. female with a PMH of allergies and anxiety who presents to the ED for evaluation of emesis and vaginal discharge. Patient found to have positive pregnancy test, which is likely the cause of her nausea and vomiting. No abdominal pain or vaginal bleeding. Pelvic exam unremarkable. Labs WNL. Abdominal exam benign. Patient able to tolerate PO challenge before discharge. Vital signs stable. Given referral to women's health. Return precautions, discharge instructions, and follow-up was discussed with the patient before discharge.     Discharge Medication List as of 01/25/2014  8:39 PM    START taking these medications   Details  ondansetron (ZOFRAN ODT) 4 MG disintegrating tablet Take 1 tablet (4 mg total) by mouth every 8 (eight) hours as needed for nausea or vomiting., Starting 01/25/2014, Until Discontinued, Print        Final impressions: 1. Pregnancy   2. Intractable vomiting with nausea, vomiting of  unspecified type   3. Vaginal discharge       Luiz Iron PA-C            Jillyn Ledger, PA-C 01/26/14 1739

## 2014-01-25 NOTE — ED Notes (Signed)
Pt presents with c/o vomiting, nausea, and vaginal discharge. Pt says that she has vomited approx 10-15 times over the last three days. Pt says that she has only been able to keep saltine crackers down. Pt also c/o vaginal discharge and pelvic pain. Pt has a hx of BV and think this may be the same thing.

## 2014-01-26 LAB — GC/CHLAMYDIA PROBE AMP
CT Probe RNA: NEGATIVE
GC Probe RNA: NEGATIVE

## 2014-01-27 NOTE — ED Provider Notes (Signed)
Medical screening examination/treatment/procedure(s) were performed by non-physician practitioner and as supervising physician I was immediately available for consultation/collaboration.   EKG Interpretation None        William Moustafa Mossa, MD 01/27/14 0703 

## 2014-02-24 LAB — OB RESULTS CONSOLE ANTIBODY SCREEN: Antibody Screen: NEGATIVE

## 2014-02-24 LAB — OB RESULTS CONSOLE ABO/RH: RH Type: POSITIVE

## 2014-02-24 LAB — OB RESULTS CONSOLE RPR: RPR: NONREACTIVE

## 2014-02-24 LAB — OB RESULTS CONSOLE RUBELLA ANTIBODY, IGM: Rubella: IMMUNE

## 2014-02-24 LAB — OB RESULTS CONSOLE HEPATITIS B SURFACE ANTIGEN: Hepatitis B Surface Ag: NEGATIVE

## 2014-02-24 LAB — OB RESULTS CONSOLE GC/CHLAMYDIA
CHLAMYDIA, DNA PROBE: NEGATIVE
Gonorrhea: NEGATIVE

## 2014-02-24 LAB — OB RESULTS CONSOLE HIV ANTIBODY (ROUTINE TESTING): HIV: NONREACTIVE

## 2014-04-10 ENCOUNTER — Encounter: Payer: Self-pay | Admitting: *Deleted

## 2014-04-14 ENCOUNTER — Ambulatory Visit: Payer: BC Managed Care – PPO | Admitting: Neurology

## 2014-05-13 ENCOUNTER — Ambulatory Visit: Payer: BC Managed Care – PPO | Admitting: Neurology

## 2014-07-01 ENCOUNTER — Inpatient Hospital Stay (HOSPITAL_COMMUNITY)
Admission: AD | Admit: 2014-07-01 | Discharge: 2014-07-01 | Disposition: A | Discharge status: Home / Self Care | Payer: BC Managed Care – PPO | Source: Ambulatory Visit | Attending: Obstetrics and Gynecology | Admitting: Obstetrics and Gynecology

## 2014-07-01 ENCOUNTER — Encounter (HOSPITAL_COMMUNITY): Payer: Self-pay

## 2014-07-01 DIAGNOSIS — R8789 Other abnormal findings in specimens from female genital organs: Secondary | ICD-10-CM

## 2014-07-01 DIAGNOSIS — Z87891 Personal history of nicotine dependence: Secondary | ICD-10-CM | POA: Insufficient documentation

## 2014-07-01 DIAGNOSIS — O09899 Supervision of other high risk pregnancies, unspecified trimester: Secondary | ICD-10-CM

## 2014-07-01 DIAGNOSIS — Z3A26 26 weeks gestation of pregnancy: Secondary | ICD-10-CM | POA: Diagnosis not present

## 2014-07-01 MED ORDER — BETAMETHASONE SOD PHOS & ACET 6 (3-3) MG/ML IJ SUSP
12.0000 mg | Freq: Once | INTRAMUSCULAR | Status: AC
  Administered 2014-07-01: 12 mg via INTRAMUSCULAR
  Filled 2014-07-01: qty 2

## 2014-07-01 NOTE — MAU Provider Note (Signed)
History   Continuation and completion of visit started by V. Standard, CNM Barbara Vargas is a 23 y.o. G1P0 at 26.6wks who presents from office for NR NST.  Report from V.Standard states that patient fFN positive. Patient reports seen if office with c/o vaginal leakage and discharge.  Also reporting some contractions and reports adequate hydration.  Patient denies issues with urination and bowel movements.  Patient reports active fetus and denies VB.    Patient Active Problem List   Diagnosis Date Noted  . RHINITIS 09/05/2010  . INSOMNIA-SLEEP DISORDER-UNSPEC 09/05/2010  . FATIGUE 09/05/2010  . HEADACHE 09/05/2010  . FREQUENCY, URINARY 09/05/2010    Chief Complaint  Patient presents with  . Contractions   HPI  OB History    Gravida Para Term Preterm AB TAB SAB Ectopic Multiple Living   1               Past Medical History  Diagnosis Date  . Anxiety   . Allergy   . Migraine   . Seasonal allergies   . Chicken pox     during childhood  . Herpes simplex     type 2 infection  . UTI (lower urinary tract infection)     Past Surgical History  Procedure Laterality Date  . Hernia repair      Family History  Problem Relation Age of Onset  . Other Maternal Aunt     malignant tumor of breast  . CVA Paternal Grandfather   . Diabetes type I Paternal Grandfather   . Heart disease Paternal Grandfather   . Other Maternal Grandmother     malignant tumor of lung  . Other Maternal Grandfather     malignant tumor of colon  . Hirschsprung's disease Other     paternal 1st cousin    History  Substance Use Topics  . Smoking status: Former Smoker -- 0.50 packs/day for 1 years    Types: Cigarettes  . Smokeless tobacco: Not on file  . Alcohol Use: No    Allergies: No Known Allergies  Prescriptions prior to admission  Medication Sig Dispense Refill Last Dose  . ibuprofen (ADVIL,MOTRIN) 200 MG tablet Take 400 mg by mouth every 6 (six) hours as needed for headache.    Past  Month at Unknown time  . Prenatal Vit-Fe Fumarate-FA (PRENATAL MULTIVITAMIN) TABS tablet Take 1 tablet by mouth at bedtime.   06/30/2014 at Unknown time  . ondansetron (ZOFRAN ODT) 4 MG disintegrating tablet Take 1 tablet (4 mg total) by mouth every 8 (eight) hours as needed for nausea or vomiting. (Patient not taking: Reported on 07/01/2014) 15 tablet 0 Not Taking at Unknown time    ROS  See HPI Above Physical Exam   Blood pressure 128/64, pulse 105, temperature 99.1 F (37.3 C), temperature source Oral, resp. rate 16, height 5' 3.5" (1.613 m), weight 171 lb 6.4 oz (77.747 kg), last menstrual period 12/24/2013, SpO2 99 %.  Physical Exam Deferred ED Course  Assessment: IUP at 26.6wks Cat I FT  Plan: -BMZ now -Return to MAU tomorrow for 2nd dose -Bleeding and PTL Precautions -Keep appt as scheduled: 07/13/2014 -Encouraged to call if any questions or concerns arise prior to next scheduled office visit.  -Discharged to home in stable condition  Barbara Vargas CNM, MSN 07/01/2014 8:03 PM

## 2014-07-01 NOTE — MAU Note (Signed)
Patient states she was seen in the office today for abdominal pain and a vaginal discharge. In the office patient was on the monitor and having irritability, vaginal discharge was negative and a fFn was sent from the office. Patient sent to MAU for further evaluation of preterm contractions. Patient states she continues to have abdominal pain, no bleeding or leaking. Reports good fetal movement.

## 2014-07-01 NOTE — MAU Provider Note (Signed)
Barbara FennelHannah Vargas is a 23 y.o. G1P0 at 26.6 weeks arrived to MAU from the office c/o "LEAKING/DISCHARGE X 2 DAYS AND CRAMPING. NO RECENT IC. STERILE SPEC NEGATIVE FERN, NITRAZENE, AND NO POOLING. FFN DONE AND SENT STAT, PICKED UP AT 3:40P. WET PREP + LEUKORRHEA. NST--NON-REACTIVE, WITH SCATTERED MILD VARIABLES, ONE MODERATE VARIABLE. UTERINE IRRITABILITY.    History     Patient Active Problem List   Diagnosis Date Noted  . RHINITIS 09/05/2010  . INSOMNIA-SLEEP DISORDER-UNSPEC 09/05/2010  . FATIGUE 09/05/2010  . HEADACHE 09/05/2010  . FREQUENCY, URINARY 09/05/2010    Chief Complaint  Patient presents with  . Contractions   HPI  OB History    Gravida Para Term Preterm AB TAB SAB Ectopic Multiple Living   1               Past Medical History  Diagnosis Date  . Anxiety   . Allergy   . Migraine   . Seasonal allergies   . Chicken pox     during childhood  . Herpes simplex     type 2 infection  . UTI (lower urinary tract infection)     Past Surgical History  Procedure Laterality Date  . Hernia repair      Family History  Problem Relation Age of Onset  . Other Maternal Aunt     malignant tumor of breast  . CVA Paternal Grandfather   . Diabetes type I Paternal Grandfather   . Heart disease Paternal Grandfather   . Other Maternal Grandmother     malignant tumor of lung  . Other Maternal Grandfather     malignant tumor of colon  . Hirschsprung's disease Other     paternal 1st cousin    History  Substance Use Topics  . Smoking status: Former Smoker -- 0.50 packs/day for 1 years    Types: Cigarettes  . Smokeless tobacco: Not on file  . Alcohol Use: No    Allergies: No Known Allergies  Prescriptions prior to admission  Medication Sig Dispense Refill Last Dose  . ibuprofen (ADVIL,MOTRIN) 200 MG tablet Take 400 mg by mouth every 6 (six) hours as needed for headache.    Past Month at Unknown time  . Prenatal Vit-Fe Fumarate-FA (PRENATAL MULTIVITAMIN) TABS  tablet Take 1 tablet by mouth at bedtime.   06/30/2014 at Unknown time  . ondansetron (ZOFRAN ODT) 4 MG disintegrating tablet Take 1 tablet (4 mg total) by mouth every 8 (eight) hours as needed for nausea or vomiting. (Patient not taking: Reported on 07/01/2014) 15 tablet 0 Not Taking at Unknown time    ROS See HPI above, all other systems are negative  Physical Exam   Blood pressure 128/64, pulse 105, temperature 99.1 F (37.3 C), temperature source Oral, resp. rate 16, height 5' 3.5" (1.613 m), weight 171 lb 6.4 oz (77.747 kg), last menstrual period 12/24/2013, SpO2 99 %.  Physical Exam Ext:  WNL ABD: Soft, non tender to palpation, no rebound or guarding SVE: deferred   ED Course  Assessment: IUP at  26.6 weeks Membranes: FHR: Category 1 CTX:  none   Plan:  2 hr observation    Breeanna Galgano, CNM, MSN 07/01/2014. 6:22 PM

## 2014-07-01 NOTE — Discharge Instructions (Signed)
Fetal Fibronectin This is a test done to help evaluate a pregnant woman's risk of pre-term delivery. It is generally done when you are 26 to [redacted] weeks pregnant and are having symptoms of premature labor. A Dacron swab is used to take a sample of cervical or vaginal fluid from the back portion of the vagina or from the area just outside the opening of the cervix. Fetal fibronectin (fFN) is a glycoprotein that can be used to help predict the short term risk of premature delivery. fFN is produced at the boundary between the amniotic sac and the lining of the mother's uterus. This is called the unteroplacental junction. Fetal fibronectin is largely confined to this junction and thought to help maintain the integrity of the boundary. fFN is normally detectable in cervicovaginal fluid during the first 20 to 24 weeks of pregnancy, and then is detectable again after about 36 weeks.  Finding fFN in cervicovaginal fluids after 36 weeks is not unusual as it is often released by the body as it gets ready for childbirth. The elevated fFN found in vaginal fluids early in pregnancy may simply reflect the normal growth and establishment of tissues at the unteroplacental junction with levels falling when this phase is complete. What is known is that fFN that is detected between 24 and 36 weeks of pregnancy is not normal. Elevated levels reflect a disturbance at the uteroplacental junction and have been associated with an increased risk of pre-term labor and delivery. Knowing whether or not a woman is likely to deliver prematurely helps your caregiver plan a course of action. The fFN test is a relatively non-invasive tool to help the caregiver to distinguish between those who are likely to deliver shortly and those who are not.  PREPARATION FOR TEST   Inform the person conducting the test if you have a medical condition or are using any medications that cause excessive bleeding.  Do not have sexual intercourse for 24 hours  before the procedure. NORMAL FINDINGS  Pregnancy = 50 nanograms/ml Ranges for normal findings may vary among different laboratories and hospitals. You should always check with your doctor after having lab work or other tests done to discuss the meaning of your test results and whether your values are considered within normal limits. MEANING OF TEST  Your caregiver will go over the test results with you and discuss the importance and meaning of your results, as well as treatment options and the need for additional tests if necessary. OBTAINING THE TEST RESULTS  It is your responsibility to obtain your test results. Ask the lab or department performing the test when and how you will get your results. Document Released: 05/25/2004 Document Revised: 10/16/2011 Document Reviewed: 10/20/2013 [facility] Patient Information 2015 [place], [place]. This information is not intended to replace advice given to you by your health care provider. Make sure you discuss any questions you have with your health care provider.

## 2014-07-02 ENCOUNTER — Inpatient Hospital Stay (HOSPITAL_COMMUNITY)
Admission: AD | Admit: 2014-07-02 | Discharge: 2014-07-02 | Disposition: A | Discharge status: Home / Self Care | Payer: BC Managed Care – PPO | Source: Ambulatory Visit | Attending: Obstetrics and Gynecology | Admitting: Obstetrics and Gynecology

## 2014-07-02 DIAGNOSIS — Z3A27 27 weeks gestation of pregnancy: Secondary | ICD-10-CM | POA: Insufficient documentation

## 2014-07-02 DIAGNOSIS — R8789 Other abnormal findings in specimens from female genital organs: Secondary | ICD-10-CM

## 2014-07-02 DIAGNOSIS — O09899 Supervision of other high risk pregnancies, unspecified trimester: Secondary | ICD-10-CM

## 2014-07-02 DIAGNOSIS — Z87891 Personal history of nicotine dependence: Secondary | ICD-10-CM | POA: Diagnosis not present

## 2014-07-02 DIAGNOSIS — O368120 Decreased fetal movements, second trimester, not applicable or unspecified: Secondary | ICD-10-CM | POA: Diagnosis not present

## 2014-07-02 MED ORDER — BETAMETHASONE SOD PHOS & ACET 6 (3-3) MG/ML IJ SUSP
12.0000 mg | Freq: Once | INTRAMUSCULAR | Status: AC
  Administered 2014-07-02: 12 mg via INTRAMUSCULAR
  Filled 2014-07-02: qty 2

## 2014-07-02 NOTE — MAU Provider Note (Signed)
History    Barbara FennelHannah Vargas is a 23 y.o. G1P0 at 27wks who returned for second dose of BMZ. During nurse assessment patient reporting decreased fetal movement.  Patient states fetus has been moving, but not as frequent as usual.  Patient denies other issues including LOF, VB, and contractions.    Patient Active Problem List   Diagnosis Date Noted  . RHINITIS 09/05/2010  . INSOMNIA-SLEEP DISORDER-UNSPEC 09/05/2010  . FATIGUE 09/05/2010  . HEADACHE 09/05/2010  . FREQUENCY, URINARY 09/05/2010    Chief Complaint  Patient presents with  . Injections    2nd BMZ shot   HPI  OB History    Gravida Para Term Preterm AB TAB SAB Ectopic Multiple Living   1               Past Medical History  Diagnosis Date  . Anxiety   . Allergy   . Migraine   . Seasonal allergies   . Chicken pox     during childhood  . Herpes simplex     type 2 infection  . UTI (lower urinary tract infection)     Past Surgical History  Procedure Laterality Date  . Hernia repair      Family History  Problem Relation Age of Onset  . Other Maternal Aunt     malignant tumor of breast  . CVA Paternal Grandfather   . Diabetes type I Paternal Grandfather   . Heart disease Paternal Grandfather   . Other Maternal Grandmother     malignant tumor of lung  . Other Maternal Grandfather     malignant tumor of colon  . Hirschsprung's disease Other     paternal 1st cousin    History  Substance Use Topics  . Smoking status: Former Smoker -- 0.50 packs/day for 1 years    Types: Cigarettes  . Smokeless tobacco: Not on file  . Alcohol Use: No    Allergies: No Known Allergies  Prescriptions prior to admission  Medication Sig Dispense Refill Last Dose  . acetaminophen (TYLENOL) 325 MG tablet Take 650 mg by mouth every 6 (six) hours as needed for headache.   Past Week at Unknown time  . Prenatal Vit-Fe Fumarate-FA (PRENATAL MULTIVITAMIN) TABS tablet Take 1 tablet by mouth at bedtime.   07/01/2014 at 2200     ROS  See HPI Above Physical Exam   Blood pressure 129/69, pulse 90, temperature 98.5 F (36.9 C), temperature source Oral, resp. rate 18, last menstrual period 12/24/2013, SpO2 100 %.  Physical Exam Deferred FHR:  150 bpm, Mod Var, + Variable Decels, +Accels --Mild variable decels noted UC: None graphed or palpated ED Course  Assessment: IUP at 27wks Cat II FT +fFN Decreased FM  Plan: -FHR reassuring -Cat II FT resolved with left tilt -PTL and Bleeding Precautions -Keep appt as scheduled: 07/13/2014 -Encouraged to call if any questions or concerns arise prior to next scheduled office visit.  -Discharged to home in stable condition  Barbara Vargas LYNN CNM, MSN 07/02/2014 9:51 PM

## 2014-07-02 NOTE — MAU Note (Signed)
No changes since last night. Here for 2nd betamethasone injection. Denies contractions/LOF/vaginal bleeding. Decreased fetal movement. States hasn't felt baby move since this morning.

## 2014-07-02 NOTE — Discharge Instructions (Signed)
Second Trimester of Pregnancy The second trimester is from week 13 through week 28, month 4 through 6. This is often the time in pregnancy that you feel your best. Often times, morning sickness has lessened or quit. You may have more energy, and you may get hungry more often. Your unborn baby (fetus) is growing rapidly. At the end of the sixth month, he or she is about 9 inches long and weighs about 1 pounds. You will likely feel the baby move (quickening) between 18 and 20 weeks of pregnancy. HOME CARE   Avoid all smoking, herbs, and alcohol. Avoid drugs not approved by your doctor.  Only take medicine as told by your doctor. Some medicines are safe and some are not during pregnancy.  Exercise only as told by your doctor. Stop exercising if you start having cramps.  Eat regular, healthy meals.  Wear a good support bra if your breasts are tender.  Do not use hot tubs, steam rooms, or saunas.  Wear your seat belt when driving.  Avoid raw meat, uncooked cheese, and liter boxes and soil used by cats.  Take your prenatal vitamins.  Try taking medicine that helps you poop (stool softener) as needed, and if your doctor approves. Eat more fiber by eating fresh fruit, vegetables, and whole grains. Drink enough fluids to keep your pee (urine) clear or pale yellow.  Take warm water baths (sitz baths) to soothe pain or discomfort caused by hemorrhoids. Use hemorrhoid cream if your doctor approves.  If you have puffy, bulging veins (varicose veins), wear support hose. Raise (elevate) your feet for 15 minutes, 3-4 times a day. Limit salt in your diet.  Avoid heavy lifting, wear low heals, and sit up straight.  Rest with your legs raised if you have leg cramps or low back pain.  Visit your dentist if you have not gone during your pregnancy. Use a soft toothbrush to brush your teeth. Be gentle when you floss.  You can have sex (intercourse) unless your doctor tells you not to.  Go to your  doctor visits. GET HELP IF:   You feel dizzy.  You have mild cramps or pressure in your lower belly (abdomen).  You have a nagging pain in your belly area.  You continue to feel sick to your stomach (nauseous), throw up (vomit), or have watery poop (diarrhea).  You have bad smelling fluid coming from your vagina.  You have pain with peeing (urination). GET HELP RIGHT AWAY IF:   You have a fever.  You are leaking fluid from your vagina.  You have spotting or bleeding from your vagina.  You have severe belly cramping or pain.  You lose or gain weight rapidly.  You have trouble catching your breath and have chest pain.  You notice sudden or extreme puffiness (swelling) of your face, hands, ankles, feet, or legs.  You have not felt the baby move in over an hour.  You have severe headaches that do not go away with medicine.  You have vision changes. Document Released: 10/18/2009 Document Revised: 11/18/2012 Document Reviewed: 09/24/2012 [employer] Patient Information 2015 [employer]. This information is not intended to replace advice given to you by your health care provider. Make sure you discuss any questions you have with your health care provider.  

## 2014-07-06 ENCOUNTER — Encounter (HOSPITAL_COMMUNITY): Payer: Self-pay

## 2014-07-06 ENCOUNTER — Inpatient Hospital Stay (HOSPITAL_COMMUNITY)
Admission: AD | Admit: 2014-07-06 | Discharge: 2014-07-06 | Disposition: A | Discharge status: Home / Self Care | Payer: BC Managed Care – PPO | Source: Ambulatory Visit | Attending: Obstetrics and Gynecology | Admitting: Obstetrics and Gynecology

## 2014-07-06 ENCOUNTER — Inpatient Hospital Stay (HOSPITAL_COMMUNITY): Payer: BC Managed Care – PPO

## 2014-07-06 DIAGNOSIS — Z87891 Personal history of nicotine dependence: Secondary | ICD-10-CM | POA: Diagnosis not present

## 2014-07-06 DIAGNOSIS — Z3A27 27 weeks gestation of pregnancy: Secondary | ICD-10-CM | POA: Insufficient documentation

## 2014-07-06 DIAGNOSIS — A6000 Herpesviral infection of urogenital system, unspecified: Secondary | ICD-10-CM | POA: Diagnosis not present

## 2014-07-06 DIAGNOSIS — R1013 Epigastric pain: Secondary | ICD-10-CM | POA: Diagnosis present

## 2014-07-06 DIAGNOSIS — O9989 Other specified diseases and conditions complicating pregnancy, childbirth and the puerperium: Secondary | ICD-10-CM | POA: Diagnosis not present

## 2014-07-06 DIAGNOSIS — G43909 Migraine, unspecified, not intractable, without status migrainosus: Secondary | ICD-10-CM | POA: Diagnosis not present

## 2014-07-06 DIAGNOSIS — R1011 Right upper quadrant pain: Secondary | ICD-10-CM | POA: Insufficient documentation

## 2014-07-06 LAB — CBC
HCT: 33.5 % — ABNORMAL LOW (ref 36.0–46.0)
Hemoglobin: 11.3 g/dL — ABNORMAL LOW (ref 12.0–15.0)
MCH: 30.1 pg (ref 26.0–34.0)
MCHC: 33.7 g/dL (ref 30.0–36.0)
MCV: 89.1 fL (ref 78.0–100.0)
Platelets: 184 10*3/uL (ref 150–400)
RBC: 3.76 MIL/uL — ABNORMAL LOW (ref 3.87–5.11)
RDW: 12.6 % (ref 11.5–15.5)
WBC: 11.4 10*3/uL — ABNORMAL HIGH (ref 4.0–10.5)

## 2014-07-06 LAB — COMPREHENSIVE METABOLIC PANEL
ALBUMIN: 3.1 g/dL — AB (ref 3.5–5.2)
ALT: 14 U/L (ref 0–35)
AST: 15 U/L (ref 0–37)
Alkaline Phosphatase: 66 U/L (ref 39–117)
Anion gap: 13 (ref 5–15)
BILIRUBIN TOTAL: 0.2 mg/dL — AB (ref 0.3–1.2)
BUN: 7 mg/dL (ref 6–23)
CHLORIDE: 100 meq/L (ref 96–112)
CO2: 23 mEq/L (ref 19–32)
CREATININE: 0.43 mg/dL — AB (ref 0.50–1.10)
Calcium: 9.7 mg/dL (ref 8.4–10.5)
GFR calc Af Amer: >90 mL/min (ref >90)
GFR calc non Af Amer: >90 mL/min (ref >90)
Glucose, Bld: 76 mg/dL (ref 70–99)
POTASSIUM: 4 meq/L (ref 3.7–5.3)
Sodium: 136 mEq/L — ABNORMAL LOW (ref 137–147)
Total Protein: 6.7 g/dL (ref 6.0–8.3)

## 2014-07-06 LAB — URINALYSIS, ROUTINE W REFLEX MICROSCOPIC
Bilirubin Urine: NEGATIVE
Glucose, UA: NEGATIVE mg/dL
Hgb urine dipstick: NEGATIVE
Ketones, ur: NEGATIVE mg/dL
Nitrite: NEGATIVE
PH: 7 (ref 5.0–8.0)
Protein, ur: NEGATIVE mg/dL
Specific Gravity, Urine: 1.015 (ref 1.005–1.030)
Urobilinogen, UA: 0.2 mg/dL (ref 0.0–1.0)

## 2014-07-06 LAB — LIPASE, BLOOD: LIPASE: 17 U/L (ref 11–59)

## 2014-07-06 LAB — URIC ACID: Uric Acid, Serum: 4.3 mg/dL (ref 2.4–7.0)

## 2014-07-06 LAB — URINE MICROSCOPIC-ADD ON

## 2014-07-06 LAB — AMYLASE: Amylase: 37 U/L (ref 0–105)

## 2014-07-06 LAB — LACTATE DEHYDROGENASE: LDH: 114 U/L (ref 94–250)

## 2014-07-06 MED ORDER — LACTATED RINGERS IV SOLN
INTRAVENOUS | Status: DC
  Administered 2014-07-06: 15:00:00 via INTRAVENOUS

## 2014-07-06 MED ORDER — ONDANSETRON HCL 4 MG/2ML IJ SOLN
4.0000 mg | Freq: Once | INTRAMUSCULAR | Status: AC
  Administered 2014-07-06: 4 mg via INTRAVENOUS
  Filled 2014-07-06: qty 2

## 2014-07-06 MED ORDER — BUTORPHANOL TARTRATE 1 MG/ML IJ SOLN
1.0000 mg | INTRAMUSCULAR | Status: DC | PRN
  Administered 2014-07-06: 1 mg via INTRAVENOUS
  Filled 2014-07-06: qty 1

## 2014-07-06 MED ORDER — HYDROCODONE-ACETAMINOPHEN 5-325 MG PO TABS: 1.0000 | ORAL_TABLET | ORAL | Status: DC | PRN

## 2014-07-06 MED ORDER — HYDROMORPHONE HCL 1 MG/ML IJ SOLN
1.0000 mg | INTRAMUSCULAR | Status: DC | PRN
  Administered 2014-07-06: 1 mg via INTRAVENOUS
  Filled 2014-07-06: qty 1

## 2014-07-06 MED ORDER — LACTATED RINGERS IV BOLUS (SEPSIS)
500.0000 mL | Freq: Once | INTRAVENOUS | Status: AC
  Administered 2014-07-06: 500 mL via INTRAVENOUS

## 2014-07-06 MED ORDER — HYDROCODONE-ACETAMINOPHEN 5-325 MG PO TABS
1.0000 | ORAL_TABLET | ORAL | Status: DC | PRN
  Administered 2014-07-06: 1 via ORAL
  Filled 2014-07-06: qty 1

## 2014-07-06 MED ORDER — PANTOPRAZOLE SODIUM 40 MG IV SOLR
40.0000 mg | Freq: Once | INTRAVENOUS | Status: AC
  Administered 2014-07-06: 40 mg via INTRAVENOUS
  Filled 2014-07-06: qty 40

## 2014-07-06 NOTE — Progress Notes (Signed)
Lanelle BalLLamon, RN answering and Animatorrelaying info to [place], [place]CNM. Pt here for upper abd pain and nausea, no ctx's, efm tracing wnl. U/A pending.

## 2014-07-06 NOTE — MAU Note (Signed)
Patient became nauseated and diaphoretic in triage. Assisted to the bathroom and into a room.

## 2014-07-06 NOTE — MAU Provider Note (Signed)
History   23 yo G 1P0 at 3127 4/7 weeks presented after calling office with mid-abdominal pain and nausea.  No vomiting.  Denied leaking or bleeding, "felt different than contractions" noted last week.  Reports +FM.  No dysuria or frequency.  No known viral exposures.  No constipation or diarrhea.  Last ate at noon--cereal.    Patient Active Problem List   Diagnosis Date Noted  . Abdominal pain, epigastric 07/06/2014  . Migraines 07/06/2014  . 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 Preterm labor--+ FFN 11/25, received betamethasone course 07/06/2014    Chief Complaint  Patient presents with  . Abdominal Pain   HPI:  See above  OB History    Gravida Para Term Preterm AB TAB SAB Ectopic Multiple Living   1               Past Medical History  Diagnosis Date  . Anxiety   . Allergy   . Migraine   . Seasonal allergies   . Chicken pox     during childhood  . Herpes simplex     type 2 infection  . UTI (lower urinary tract infection)   . Preterm labor     Past Surgical History  Procedure Laterality Date  . Hernia repair      Family History  Problem Relation Age of Onset  . Other Maternal Aunt     malignant tumor of breast  . CVA Paternal Grandfather   . Diabetes type I Paternal Grandfather   . Heart disease Paternal Grandfather   . Other Maternal Grandmother     malignant tumor of lung  . Other Maternal Grandfather     malignant tumor of colon  . Hirschsprung's disease Other     paternal 1st cousin    History  Substance Use Topics  . Smoking status: Former Smoker -- 0.50 packs/day for 1 years    Types: Cigarettes  . Smokeless tobacco: Never Used  . Alcohol Use: No    Allergies: No Known Allergies  Prescriptions prior to admission  Medication Sig Dispense Refill Last Dose  . acetaminophen (TYLENOL) 325 MG tablet Take 325 mg by mouth every 6 (six) hours as needed for headache.    Past Month at Unknown time  . GuaiFENesin (MUCINEX PO) Take 1 tablet by mouth daily.    07/05/2014 at Unknown time  . Prenatal Vit-Fe Fumarate-FA (PRENATAL MULTIVITAMIN) TABS tablet Take 1 tablet by mouth at bedtime.   07/05/2014 at Unknown time    ROS:  Upper abdominal pain, nausea, +FM Physical Exam   Blood pressure 137/73, pulse 109, temperature 98.7 F (37.1 C), resp. rate 18, last menstrual period 12/24/2013, SpO2 100 %.  Physical Exam  Appears uncomfortable, but not in severe pain. Chest clear Heart RRR without murmur Abd soft, mild tenderness in right midline and RUQ.  No rebound or guarding. Pelvic--cervix closed, long, firm, pp OOP.   Uterus soft, NT, no contractions palpated. Ext WNL  FHR reassuring for EGA No UCs  ED Course  Assessment: IUP at 27 4/7 weeks Upper abdominal pain Hx +FFN, betamethasone course 11/25-11/26.  Plan: CBC, CMP, LDH, uric acid, amylase, lipase IV hydration Protonix Zofran Pain med prn. Abdominal US at 6pm. Dr. Stefano GaulStringer updated.    Nigel BridgemanLATHAM, Grizel Vesely CNM, MSN 07/06/2014 3:35 PM   Addendum: Returned from abdominal US:  All WNL.  Results for orders placed or performed during the hospital encounter of 07/06/14 (from the past 24 hour(s))  Urinalysis, Routine w reflex microscopic  Status: Abnormal   Collection Time: 07/06/14  1:00 PM  Result Value Ref Range   Color, Urine YELLOW YELLOW   APPearance HAZY (A) CLEAR   Specific Gravity, Urine 1.015 1.005 - 1.030   pH 7.0 5.0 - 8.0   Glucose, UA NEGATIVE NEGATIVE mg/dL   Hgb urine dipstick NEGATIVE NEGATIVE   Bilirubin Urine NEGATIVE NEGATIVE   Ketones, ur NEGATIVE NEGATIVE mg/dL   Protein, ur NEGATIVE NEGATIVE mg/dL   Urobilinogen, UA 0.2 0.0 - 1.0 mg/dL   Nitrite NEGATIVE NEGATIVE   Leukocytes, UA SMALL (A) NEGATIVE  Urine microscopic-add on     Status: Abnormal   Collection Time: 07/06/14  1:00 PM  Result Value Ref Range   Squamous Epithelial / LPF FEW (A) RARE   WBC, UA 3-6 <3 WBC/hpf   Bacteria, UA FEW (A) RARE  CBC     Status: Abnormal   Collection Time:  07/06/14  2:19 PM  Result Value Ref Range   WBC 11.4 (H) 4.0 - 10.5 K/uL   RBC 3.76 (L) 3.87 - 5.11 MIL/uL   Hemoglobin 11.3 (L) 12.0 - 15.0 g/dL   HCT 09.833.5 (L) 11.936.0 - 14.746.0 %   MCV 89.1 78.0 - 100.0 fL   MCH 30.1 26.0 - 34.0 pg   MCHC 33.7 30.0 - 36.0 g/dL   RDW 82.912.6 56.211.5 - 13.015.5 %   Platelets 184 150 - 400 K/uL  Comprehensive metabolic panel     Status: Abnormal   Collection Time: 07/06/14  2:19 PM  Result Value Ref Range   Sodium 136 (L) 137 - 147 mEq/L   Potassium 4.0 3.7 - 5.3 mEq/L   Chloride 100 96 - 112 mEq/L   CO2 23 19 - 32 mEq/L   Glucose, Bld 76 70 - 99 mg/dL   BUN 7 6 - 23 mg/dL   Creatinine, Ser 8.650.43 (L) 0.50 - 1.10 mg/dL   Calcium 9.7 8.4 - 78.410.5 mg/dL   Total Protein 6.7 6.0 - 8.3 g/dL   Albumin 3.1 (L) 3.5 - 5.2 g/dL   AST 15 0 - 37 U/L   ALT 14 0 - 35 U/L   Alkaline Phosphatase 66 39 - 117 U/L   Total Bilirubin 0.2 (L) 0.3 - 1.2 mg/dL   GFR calc non Af Amer >90 >90 mL/min   GFR calc Af Amer >90 >90 mL/min   Anion gap 13 5 - 15  Lactate dehydrogenase     Status: None   Collection Time: 07/06/14  2:19 PM  Result Value Ref Range   LDH 114 94 - 250 U/L  Uric acid     Status: None   Collection Time: 07/06/14  2:19 PM  Result Value Ref Range   Uric Acid, Serum 4.3 2.4 - 7.0 mg/dL  Amylase     Status: None   Collection Time: 07/06/14  2:19 PM  Result Value Ref Range   Amylase 37 0 - 105 U/L  Lipase, blood     Status: None   Collection Time: 07/06/14  2:19 PM  Result Value Ref Range   Lipase 17 11 - 59 U/L    Received Dilaudid x 1 dose with some benefit, then Stadol 1 mg with same degree of benefit. No vomiting. Pain still present, now more in RLQ, but no guarding or rebound.   No evidence of surgical/acute abdomen. Patient hungry.  Consulted with Dr. Stefano GaulStringer. D/C home with abdominal pain precautions. Rx Vicodin, with 1 tab given before d/c. Notes for work absences  for last Thursday and Friday, and for today and tomorrow. Patient to f/u with [facility]  with any worsening of sx. I will have office call patient tomorrow to check on status, and plan recheck in the office later this week.  Nigel Bridgeman, CNM 07/06/14 7:40p

## 2014-07-06 NOTE — Progress Notes (Signed)
Called MAU, notified of pt's c/o nausea, no vomiting. Upper abdominal pain, no u/c's noted. Orders obtained and will come see pt shortly.

## 2014-07-06 NOTE — Discharge Instructions (Signed)

## 2014-07-06 NOTE — MAU Note (Signed)
Patient states she has been having abdominal pain since last night, did come and go but now constant. Denies vomiting but does have a little nausea, no bleeding or leaking. Reports fetal movement but took Musinex last night and has not felt as much movement.

## 2014-07-06 NOTE — MAU Note (Signed)
No s/s of allergic reaction or sensitivity to pain medication noted. Patient states that pain is still a 6of10.

## 2014-07-06 NOTE — Progress Notes (Signed)
Work note provided per pt's request.   Sherre ScarletKimberly Joanna Vargas, CNM 07/06/14, 8:05 PM

## 2014-07-06 NOTE — MAU Note (Signed)
Pt states here for constant upper abdominal pain since yesterday. Does not feel like contractions. Denies bleeding or abnormal vaginal discharge. Has been constantly nauseated however no vomiting or diarrhea.

## 2014-07-06 NOTE — Progress Notes (Signed)
Notified that pt now feels pain more in rlq. Tender to touch. Stadol helped somewhat. Awaiting u/s and results.

## 2014-08-07 ENCOUNTER — Inpatient Hospital Stay (HOSPITAL_COMMUNITY)
Admission: AD | Admit: 2014-08-07 | Discharge: 2014-08-07 | Disposition: A | Discharge status: Home / Self Care | Payer: Medicaid Other | Source: Ambulatory Visit | Attending: Obstetrics & Gynecology | Admitting: Obstetrics & Gynecology

## 2014-08-07 ENCOUNTER — Encounter (HOSPITAL_COMMUNITY): Payer: Self-pay | Admitting: *Deleted

## 2014-08-07 DIAGNOSIS — O99613 Diseases of the digestive system complicating pregnancy, third trimester: Secondary | ICD-10-CM | POA: Diagnosis not present

## 2014-08-07 DIAGNOSIS — Z3A32 32 weeks gestation of pregnancy: Secondary | ICD-10-CM | POA: Insufficient documentation

## 2014-08-07 DIAGNOSIS — O365930 Maternal care for other known or suspected poor fetal growth, third trimester, not applicable or unspecified: Secondary | ICD-10-CM | POA: Insufficient documentation

## 2014-08-07 DIAGNOSIS — O26853 Spotting complicating pregnancy, third trimester: Secondary | ICD-10-CM | POA: Diagnosis not present

## 2014-08-07 DIAGNOSIS — N898 Other specified noninflammatory disorders of vagina: Secondary | ICD-10-CM | POA: Insufficient documentation

## 2014-08-07 DIAGNOSIS — Z87891 Personal history of nicotine dependence: Secondary | ICD-10-CM | POA: Insufficient documentation

## 2014-08-07 DIAGNOSIS — K219 Gastro-esophageal reflux disease without esophagitis: Secondary | ICD-10-CM | POA: Diagnosis not present

## 2014-08-07 DIAGNOSIS — N920 Excessive and frequent menstruation with regular cycle: Secondary | ICD-10-CM | POA: Diagnosis not present

## 2014-08-07 HISTORY — DX: Unspecified ovarian cyst, unspecified side: N83.209

## 2014-08-07 LAB — WET PREP, GENITAL
Clue Cells Wet Prep HPF POC: NONE SEEN
TRICH WET PREP: NONE SEEN
Yeast Wet Prep HPF POC: NONE SEEN

## 2014-08-07 LAB — URINALYSIS, ROUTINE W REFLEX MICROSCOPIC
Bilirubin Urine: NEGATIVE
Glucose, UA: NEGATIVE mg/dL
Hgb urine dipstick: NEGATIVE
KETONES UR: NEGATIVE mg/dL
Nitrite: NEGATIVE
PH: 8 (ref 5.0–8.0)
PROTEIN: NEGATIVE mg/dL
Specific Gravity, Urine: 1.015 (ref 1.005–1.030)
Urobilinogen, UA: 0.2 mg/dL (ref 0.0–1.0)

## 2014-08-07 LAB — URINE MICROSCOPIC-ADD ON

## 2014-08-07 LAB — AMNISURE RUPTURE OF MEMBRANE (ROM) NOT AT ARMC: Amnisure ROM: NEGATIVE

## 2014-08-07 MED ORDER — PANTOPRAZOLE SODIUM 40 MG PO TBEC: 40.0000 mg | DELAYED_RELEASE_TABLET | Freq: Every day | ORAL | Status: DC

## 2014-08-07 NOTE — MAU Provider Note (Signed)
History   24 yo G1P0 at 78 1/7 weeks presented unannounced c/o brown spotting and increased d/c this am.  Denies uterine cramping or active bleeding.  Reports +FM.  Last IC 2 days ago.  Being followed for SGA, with last [place] 2 weeks ago, with growth at 15%ile.  Also having "burning" sensation in LUQ, with reflux.  Hx +FFN at 27 weeks, with betamethasone course given 11/25 and 11/26.  Patient Active Problem List   Diagnosis Date Noted  . Spotting 08/07/2014  . SGA (small for gestational age) 08/07/2014  . Abdominal pain, epigastric 07/06/2014  . Migraines 07/06/2014  . 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 Preterm labor--+ FFN 11/25, received betamethasone course 07/06/2014    Chief Complaint  Patient presents with  . Vaginal Discharge   HPI:  As above  OB History    Gravida Para Term Preterm AB TAB SAB Ectopic Multiple Living   1               Past Medical History  Diagnosis Date  . Anxiety   . Allergy   . Migraine   . Seasonal allergies   . Chicken pox     during childhood  . Herpes simplex     type 2 infection  . UTI (lower urinary tract infection)   . Preterm labor     Past Surgical History  Procedure Laterality Date  . Hernia repair      Family History  Problem Relation Age of Onset  . Other Maternal Aunt     malignant tumor of breast  . CVA Paternal Grandfather   . Diabetes type I Paternal Grandfather   . Heart disease Paternal Grandfather   . Other Maternal Grandmother     malignant tumor of lung  . Other Maternal Grandfather     malignant tumor of colon  . Hirschsprung's disease Other     paternal 1st cousin    History  Substance Use Topics  . Smoking status: Former Smoker -- 0.50 packs/day for 1 years    Types: Cigarettes  . Smokeless tobacco: Never Used  . Alcohol Use: No    Allergies: No Known Allergies  Prescriptions prior to admission  Medication Sig Dispense Refill Last Dose  . acetaminophen (TYLENOL) 325 MG tablet Take  325 mg by mouth every 6 (six) hours as needed for headache.    Past Month at Unknown time  . GuaiFENesin (MUCINEX PO) Take 1 tablet by mouth daily.   07/05/2014 at Unknown time  . HYDROcodone-acetaminophen (NORCO/VICODIN) 5-325 MG per tablet Take 1 tablet by mouth every 4 (four) hours as needed for moderate pain. 30 tablet 0   . Prenatal Vit-Fe Fumarate-FA (PRENATAL MULTIVITAMIN) TABS tablet Take 1 tablet by mouth at bedtime.   07/05/2014 at Unknown time    ROS:  Brown and thin white d/c, +FM, LUQ pain. Physical Exam   Blood pressure 135/68, pulse 114, temperature 98.9 F (37.2 C), temperature source Oral, resp. rate 18, weight 173 lb (78.472 kg), last menstrual period 12/24/2013.  Physical Exam  In NAD Chest clear Heart RRR without murmur Abd gravid, NT Pelvic--cervix closed, long, small amount thin white d/c in vault. Speculum exam negative for any bleeding or cervical friability. Ext WNL  FHR Category 1 UCs none  ED Course  Assessment: IUP at 32 1/7 weeks Vaginal d/c Hx +FFN--received betamethasone 11/25  Plan: Amnisure Wet prep UA  GC, chlamydia on urine.   Nigel Bridgeman CNM, MSN 08/07/2014 1:06 PM  Addendum:  Results for orders placed or performed during the hospital encounter of 08/07/14 (from the past 24 hour(s))  Urinalysis, Routine w reflex microscopic     Status: Abnormal   Collection Time: 08/07/14 12:57 PM  Result Value Ref Range   Color, Urine YELLOW YELLOW   APPearance CLOUDY (A) CLEAR   Specific Gravity, Urine 1.015 1.005 - 1.030   pH 8.0 5.0 - 8.0   Glucose, UA NEGATIVE NEGATIVE mg/dL   Hgb urine dipstick NEGATIVE NEGATIVE   Bilirubin Urine NEGATIVE NEGATIVE   Ketones, ur NEGATIVE NEGATIVE mg/dL   Protein, ur NEGATIVE NEGATIVE mg/dL   Urobilinogen, UA 0.2 0.0 - 1.0 mg/dL   Nitrite NEGATIVE NEGATIVE   Leukocytes, UA TRACE (A) NEGATIVE  Urine microscopic-add on     Status: Abnormal   Collection Time: 08/07/14 12:57 PM  Result Value Ref Range    Squamous Epithelial / LPF MANY (A) RARE   WBC, UA 3-6 <3 WBC/hpf   Bacteria, UA FEW (A) RARE  Wet prep, genital     Status: Abnormal   Collection Time: 08/07/14  1:15 PM  Result Value Ref Range   Yeast Wet Prep HPF POC NONE SEEN NONE SEEN   Trich, Wet Prep NONE SEEN NONE SEEN   Clue Cells Wet Prep HPF POC NONE SEEN NONE SEEN   WBC, Wet Prep HPF POC FEW (A) NONE SEEN  Amnisure rupture of membrane (rom)     Status: None   Collection Time: 08/07/14  1:16 PM  Result Value Ref Range   Amnisure ROM NEGATIVE    No evidence SROM or abnormal vaginal d/c.  Impression: IUP at 32 1/7 weeks Leukorrhea Reflux Brown spotting s/p recent IC.  Plan: D/C home Rx Protonix F/u as scheduled at [facility], call prn.  Nigel Bridgeman, CNM 08/07/14 3p

## 2014-08-07 NOTE — Discharge Instructions (Signed)
Gastroesophageal Reflux Disease, Adult Gastroesophageal reflux disease (GERD) happens when acid from your stomach flows up into the esophagus. When acid comes in contact with the esophagus, the acid causes soreness (inflammation) in the esophagus. Over time, GERD may create small holes (ulcers) in the lining of the esophagus. CAUSES   Increased body weight. This puts pressure on the stomach, making acid rise from the stomach into the esophagus.  Smoking. This increases acid production in the stomach.  Drinking alcohol. This causes decreased pressure in the lower esophageal sphincter (valve or ring of muscle between the esophagus and stomach), allowing acid from the stomach into the esophagus.  Late evening meals and a full stomach. This increases pressure and acid production in the stomach.  A malformed lower esophageal sphincter. Sometimes, no cause is found. SYMPTOMS   Burning pain in the lower part of the mid-chest behind the breastbone and in the mid-stomach area. This may occur twice a week or more often.  Trouble swallowing.  Sore throat.  Dry cough.  Asthma-like symptoms including chest tightness, shortness of breath, or wheezing. DIAGNOSIS  Your caregiver may be able to diagnose GERD based on your symptoms. In some cases, X-rays and other tests may be done to check for complications or to check the condition of your stomach and esophagus. TREATMENT  Your caregiver may recommend over-the-counter or prescription medicines to help decrease acid production. Ask your caregiver before starting or adding any new medicines.  HOME CARE INSTRUCTIONS   Change the factors that you can control. Ask your caregiver for guidance concerning weight loss, quitting smoking, and alcohol consumption.  Avoid foods and drinks that make your symptoms worse, such as:  Caffeine or alcoholic drinks.  Chocolate.  Peppermint or mint flavorings.  Garlic and onions.  Spicy foods.  Citrus fruits,  such as oranges, lemons, or limes.  Tomato-based foods such as sauce, chili, salsa, and pizza.  Fried and fatty foods.  Avoid lying down for the 3 hours prior to your bedtime or prior to taking a nap.  Eat small, frequent meals instead of large meals.  Wear loose-fitting clothing. Do not wear anything tight around your waist that causes pressure on your stomach.  Raise the head of your bed 6 to 8 inches with wood blocks to help you sleep. Extra pillows will not help.  Only take over-the-counter or prescription medicines for pain, discomfort, or fever as directed by your caregiver.  Do not take aspirin, ibuprofen, or other nonsteroidal anti-inflammatory drugs (NSAIDs). SEEK IMMEDIATE MEDICAL CARE IF:   You have pain in your arms, neck, jaw, teeth, or back.  Your pain increases or changes in intensity or duration.  You develop nausea, vomiting, or sweating (diaphoresis).  You develop shortness of breath, or you faint.  Your vomit is green, yellow, black, or looks like coffee grounds or blood.  Your stool is red, bloody, or black. These symptoms could be signs of other problems, such as heart disease, gastric bleeding, or esophageal bleeding. MAKE SURE YOU:   Understand these instructions.  Will watch your condition.  Will get help right away if you are not doing well or get worse. Document Released: 05/03/2005 Document Revised: 10/16/2011 Document Reviewed: 02/10/2011 Exit[employer] Patient Information 2015 [employer]. This information is not intended to replace advice given to you by your health care provider. Make sure you discuss any questions you have with your health care provider.  

## 2014-08-07 NOTE — L&D Delivery Note (Signed)
Delivery Note At 12:25 PM a viable female, "Barbara Vargas", was delivered via Vaginal, Spontaneous Delivery (Presentation: Left Occiput Anterior).  APGAR: 8, 9; weight  .   Placenta status: Intact, Spontaneous.  Cord: 3 vessels with the following complications: None.  Cord pH: NA Increased bleeding after delivery of placenta--1000 mcg Cytotech placed per rectum, with fundus firm with massage. Still had moderate bleeding after good uterine tone achieved. Speculum exam performed--no evidence of cervical laceration, small left vaginal side wall laceration noted--appeared to be origin of bleeding. Repaired with 3-0 vicryl, with hemostasis achieved.  Anesthesia: Epidural, local Episiotomy: None Lacerations: Vaginal, 1st degree right sidewall. Suture Repair: 3.0 vicryl Est. Blood Loss (mL): 400 cc  Mom to postpartum.  Baby to Couplet care / Skin to Skin.  Tiari Andringa 10/10/2014, 1:17 PM

## 2014-08-07 NOTE — MAU Note (Addendum)
Had some spotting this morning, brownish.  Also has some watery d/c. Upper abd pain, started yesterday, comes and goes. No placental issues

## 2014-08-12 LAB — GC/CHLAMYDIA PROBE AMP
CT PROBE, AMP APTIMA: NEGATIVE
GC Probe RNA: NEGATIVE

## 2014-09-10 LAB — OB RESULTS CONSOLE GBS: GBS: NEGATIVE

## 2014-10-01 ENCOUNTER — Inpatient Hospital Stay (HOSPITAL_COMMUNITY)
Admission: AD | Admit: 2014-10-01 | Discharge: 2014-10-01 | Disposition: A | Discharge status: Home / Self Care | Payer: Medicaid Other | Source: Ambulatory Visit | Attending: Obstetrics and Gynecology | Admitting: Obstetrics and Gynecology

## 2014-10-01 ENCOUNTER — Encounter (HOSPITAL_COMMUNITY): Payer: Self-pay | Admitting: General Practice

## 2014-10-01 DIAGNOSIS — Z3A40 40 weeks gestation of pregnancy: Secondary | ICD-10-CM | POA: Diagnosis not present

## 2014-10-01 DIAGNOSIS — R51 Headache: Secondary | ICD-10-CM | POA: Insufficient documentation

## 2014-10-01 DIAGNOSIS — O9989 Other specified diseases and conditions complicating pregnancy, childbirth and the puerperium: Secondary | ICD-10-CM | POA: Insufficient documentation

## 2014-10-01 LAB — URINALYSIS, ROUTINE W REFLEX MICROSCOPIC
Bilirubin Urine: NEGATIVE
Glucose, UA: NEGATIVE mg/dL
Hgb urine dipstick: NEGATIVE
Ketones, ur: NEGATIVE mg/dL
Leukocytes, UA: NEGATIVE
Nitrite: NEGATIVE
PH: 6.5 (ref 5.0–8.0)
PROTEIN: NEGATIVE mg/dL
SPECIFIC GRAVITY, URINE: 1.02 (ref 1.005–1.030)
Urobilinogen, UA: 0.2 mg/dL (ref 0.0–1.0)

## 2014-10-01 LAB — COMPREHENSIVE METABOLIC PANEL
ALK PHOS: 125 U/L — AB (ref 39–117)
ALT: 16 U/L (ref 0–35)
AST: 20 U/L (ref 0–37)
Albumin: 3.3 g/dL — ABNORMAL LOW (ref 3.5–5.2)
Anion gap: 5 (ref 5–15)
BILIRUBIN TOTAL: 0.5 mg/dL (ref 0.3–1.2)
BUN: 10 mg/dL (ref 6–23)
CHLORIDE: 107 mmol/L (ref 96–112)
CO2: 22 mmol/L (ref 19–32)
CREATININE: 0.51 mg/dL (ref 0.50–1.10)
Calcium: 9 mg/dL (ref 8.4–10.5)
Glucose, Bld: 77 mg/dL (ref 70–99)
POTASSIUM: 3.7 mmol/L (ref 3.5–5.1)
Sodium: 134 mmol/L — ABNORMAL LOW (ref 135–145)
Total Protein: 6.9 g/dL (ref 6.0–8.3)

## 2014-10-01 LAB — URIC ACID: Uric Acid, Serum: 5.7 mg/dL (ref 2.4–7.0)

## 2014-10-01 LAB — PROTEIN / CREATININE RATIO, URINE
Creatinine, Urine: 57 mg/dL
Protein Creatinine Ratio: 0.14 (ref 0.00–0.15)
Total Protein, Urine: 8 mg/dL

## 2014-10-01 LAB — CBC
HEMATOCRIT: 34 % — AB (ref 36.0–46.0)
Hemoglobin: 11.6 g/dL — ABNORMAL LOW (ref 12.0–15.0)
MCH: 30.6 pg (ref 26.0–34.0)
MCHC: 34.1 g/dL (ref 30.0–36.0)
MCV: 89.7 fL (ref 78.0–100.0)
Platelets: 163 10*3/uL (ref 150–400)
RBC: 3.79 MIL/uL — AB (ref 3.87–5.11)
RDW: 14.5 % (ref 11.5–15.5)
WBC: 10.1 10*3/uL (ref 4.0–10.5)

## 2014-10-01 LAB — LACTATE DEHYDROGENASE: LDH: 113 U/L (ref 94–250)

## 2014-10-01 MED ORDER — DIPHENHYDRAMINE HCL 25 MG PO CAPS
25.0000 mg | ORAL_CAPSULE | Freq: Once | ORAL | Status: AC
  Administered 2014-10-01: 25 mg via ORAL
  Filled 2014-10-01: qty 1

## 2014-10-01 MED ORDER — METOCLOPRAMIDE HCL 10 MG PO TABS
10.0000 mg | ORAL_TABLET | Freq: Once | ORAL | Status: AC
  Administered 2014-10-01: 10 mg via ORAL
  Filled 2014-10-01: qty 1

## 2014-10-01 MED ORDER — OXYCODONE-ACETAMINOPHEN 5-325 MG PO TABS
1.0000 | ORAL_TABLET | Freq: Once | ORAL | Status: AC
  Administered 2014-10-01: 1 via ORAL
  Filled 2014-10-01: qty 1

## 2014-10-01 MED ORDER — MAGNESIUM OXIDE 400 (241.3 MG) MG PO TABS
400.0000 mg | ORAL_TABLET | Freq: Once | ORAL | Status: AC
  Administered 2014-10-01: 400 mg via ORAL
  Filled 2014-10-01: qty 1

## 2014-10-01 NOTE — MAU Note (Signed)
Pt reports she she has had a headache for 2 days has taken tylenol and Fioricet  Without relief. C/o pressure on her forehead. Stated she blacked out for a few seconds while driving. Also c/o incresed  vaginal dicsharge that smells

## 2014-10-01 NOTE — MAU Provider Note (Signed)
History     CSN: [id number]  Arrival date and time: 10/01/14 1656   None     Chief Complaint  Patient presents with  . Headache   HPI Pt presents c/o HA x 24hrs and fioricet with codeine usually helps but has not this time.  She does not have a h/o HAs prior to pregnancy but started getting them in the first trimester.  OB History    Gravida Para Term Preterm AB TAB SAB Ectopic Multiple Living   1               Past Medical History  Diagnosis Date  . Anxiety   . Allergy   . Migraine   . Seasonal allergies   . Chicken pox     during childhood  . Herpes simplex     type 2 infection  . UTI (lower urinary tract infection)   . Preterm labor   . Ovarian cyst     Past Surgical History  Procedure Laterality Date  . Hernia repair      umbilical    Family History  Problem Relation Age of Onset  . Other Maternal Aunt     malignant tumor of breast  . CVA Paternal Grandfather   . Diabetes type I Paternal Grandfather   . Heart disease Paternal Grandfather   . Diabetes Paternal Grandfather   . Stroke Paternal Grandfather   . Other Maternal Grandmother     malignant tumor of lung  . Other Maternal Grandfather     malignant tumor of colon  . Hirschsprung's disease Other     paternal 1st cousin  . Asthma Sister   . Hearing loss Sister     History  Substance Use Topics  . Smoking status: Former Smoker -- 0.50 packs/day for 1 years    Types: Cigarettes  . Smokeless tobacco: Never Used     Comment: quit 2014  . Alcohol Use: No    Allergies: No Known Allergies  Prescriptions prior to admission  Medication Sig Dispense Refill Last Dose  . butalbital-acetaminophen-caffeine (FIORICET, ESGIC) 50-325-40 MG per tablet Take 1 tablet by mouth 2 (two) times daily as needed for headache.   09/30/2014 at Unknown time  . Prenatal Vit-Fe Fumarate-FA (PRENATAL MULTIVITAMIN) TABS tablet Take 1 tablet by mouth at bedtime.   09/30/2014 at Unknown time  . promethazine (PHENERGAN)  12.5 MG tablet Take 12.5 mg by mouth every 6 (six) hours as needed for nausea or vomiting.   09/30/2014 at Unknown time  . HYDROcodone-acetaminophen (NORCO/VICODIN) 5-325 MG per tablet Take 1 tablet by mouth every 4 (four) hours as needed for moderate pain. (Patient not taking: Reported on 08/07/2014) 30 tablet 0   . pantoprazole (PROTONIX) 40 MG tablet Take 1 tablet (40 mg total) by mouth daily. (Patient not taking: Reported on 10/01/2014) 30 tablet 2     ROS  Non-contributory  Physical Exam   Blood pressure 120/82, pulse 136, temperature 97.9 F (36.6 C), temperature source Oral, resp. rate 18, height  (1.626 m), weight 89.415 kg (197 lb 2 oz), last menstrual period 12/24/2013, SpO2 100 %.  Physical Exam  Lungs CTA CV RRR Abd gravid, NT VE FT/50% FHT cat 1  MAU Course  Procedures   Labs Urine PCR UA  Assessment and Plan  P0 at 40wks with HA.  Will try magnesium, percocet and reglan.  Labs and urine PCR are wnl.  UA wnl.  BPs are wnl.  No signs of preeclampsia or labor.  Purcell NailsROBERTS,Kenda Kloehn Y 10/01/2014, 6:36 PM   Left message at office to refer to neurology secondary to HAs.

## 2014-10-01 NOTE — MAU Provider Note (Signed)
Continuation of care from Dr. Su Hiltoberts.  S: Benadryl added to previous regimen, pain decreased to 2/10. Wants to go home. O: BP 133/79, pulse now 100 bpm, down from 136 bpm. A: Headache resolved. P: Labor precautions. Continue DFMCs per protocol. Keep next OB appt.   Barbara Vargas, CNM 10/01/2014, 9:01 PM

## 2014-10-07 ENCOUNTER — Inpatient Hospital Stay (HOSPITAL_COMMUNITY)
Admission: RE | Admit: 2014-10-07 | Discharge: 2014-10-12 | DRG: 774 | Disposition: A | Discharge status: Home / Self Care | Payer: Medicaid Other | Source: Ambulatory Visit | Attending: Obstetrics & Gynecology | Admitting: Obstetrics & Gynecology

## 2014-10-07 ENCOUNTER — Encounter (HOSPITAL_COMMUNITY): Payer: Self-pay

## 2014-10-07 ENCOUNTER — Telehealth (HOSPITAL_COMMUNITY): Payer: Self-pay | Admitting: *Deleted

## 2014-10-07 ENCOUNTER — Encounter (HOSPITAL_COMMUNITY): Payer: Self-pay | Admitting: *Deleted

## 2014-10-07 DIAGNOSIS — Z6833 Body mass index (BMI) 33.0-33.9, adult: Secondary | ICD-10-CM | POA: Insufficient documentation

## 2014-10-07 DIAGNOSIS — O480 Post-term pregnancy: Principal | ICD-10-CM

## 2014-10-07 DIAGNOSIS — O9832 Other infections with a predominantly sexual mode of transmission complicating childbirth: Secondary | ICD-10-CM | POA: Diagnosis present

## 2014-10-07 DIAGNOSIS — F419 Anxiety disorder, unspecified: Secondary | ICD-10-CM | POA: Diagnosis present

## 2014-10-07 DIAGNOSIS — D509 Iron deficiency anemia, unspecified: Secondary | ICD-10-CM

## 2014-10-07 DIAGNOSIS — G43909 Migraine, unspecified, not intractable, without status migrainosus: Secondary | ICD-10-CM | POA: Insufficient documentation

## 2014-10-07 DIAGNOSIS — D649 Anemia, unspecified: Secondary | ICD-10-CM | POA: Diagnosis present

## 2014-10-07 DIAGNOSIS — J302 Other seasonal allergic rhinitis: Secondary | ICD-10-CM | POA: Insufficient documentation

## 2014-10-07 DIAGNOSIS — Z3A40 40 weeks gestation of pregnancy: Secondary | ICD-10-CM | POA: Diagnosis present

## 2014-10-07 DIAGNOSIS — A6000 Herpesviral infection of urogenital system, unspecified: Secondary | ICD-10-CM | POA: Diagnosis present

## 2014-10-07 DIAGNOSIS — O99343 Other mental disorders complicating pregnancy, third trimester: Secondary | ICD-10-CM | POA: Diagnosis present

## 2014-10-07 DIAGNOSIS — O9902 Anemia complicating childbirth: Secondary | ICD-10-CM | POA: Diagnosis present

## 2014-10-07 DIAGNOSIS — G43009 Migraine without aura, not intractable, without status migrainosus: Secondary | ICD-10-CM

## 2014-10-07 DIAGNOSIS — F411 Generalized anxiety disorder: Secondary | ICD-10-CM | POA: Insufficient documentation

## 2014-10-07 LAB — CBC
HEMATOCRIT: 35 % — AB (ref 36.0–46.0)
HEMOGLOBIN: 11.9 g/dL — AB (ref 12.0–15.0)
MCH: 30.4 pg (ref 26.0–34.0)
MCHC: 34 g/dL (ref 30.0–36.0)
MCV: 89.3 fL (ref 78.0–100.0)
Platelets: 185 10*3/uL (ref 150–400)
RBC: 3.92 MIL/uL (ref 3.87–5.11)
RDW: 14.5 % (ref 11.5–15.5)
WBC: 10.2 10*3/uL (ref 4.0–10.5)

## 2014-10-07 LAB — TYPE AND SCREEN
ABO/RH(D): B POS
Antibody Screen: NEGATIVE

## 2014-10-07 LAB — ABO/RH: ABO/RH(D): B POS

## 2014-10-07 MED ORDER — NALBUPHINE HCL 10 MG/ML IJ SOLN
10.0000 mg | INTRAMUSCULAR | Status: DC | PRN
  Administered 2014-10-10: 10 mg via INTRAVENOUS
  Filled 2014-10-07: qty 1

## 2014-10-07 MED ORDER — MISOPROSTOL 25 MCG QUARTER TABLET
25.0000 ug | ORAL_TABLET | ORAL | Status: DC | PRN
  Administered 2014-10-07 – 2014-10-09 (×6): 25 ug via VAGINAL
  Filled 2014-10-07 (×4): qty 0.25
  Filled 2014-10-07: qty 1
  Filled 2014-10-07 (×2): qty 0.25

## 2014-10-07 MED ORDER — OXYCODONE-ACETAMINOPHEN 5-325 MG PO TABS
2.0000 | ORAL_TABLET | ORAL | Status: DC | PRN
Start: 2014-10-07 — End: 2014-10-10

## 2014-10-07 MED ORDER — LACTATED RINGERS IV SOLN
500.0000 mL | INTRAVENOUS | Status: DC | PRN
  Administered 2014-10-08 – 2014-10-10 (×2): 500 mL via INTRAVENOUS

## 2014-10-07 MED ORDER — OXYTOCIN BOLUS FROM INFUSION
500.0000 mL | INTRAVENOUS | Status: DC
  Administered 2014-10-10: 500 mL via INTRAVENOUS

## 2014-10-07 MED ORDER — LACTATED RINGERS IV SOLN
INTRAVENOUS | Status: DC
  Administered 2014-10-07 – 2014-10-09 (×5): via INTRAVENOUS
  Administered 2014-10-09: 125 mL/h via INTRAVENOUS
  Administered 2014-10-10 (×3): via INTRAVENOUS

## 2014-10-07 MED ORDER — CITRIC ACID-SODIUM CITRATE 334-500 MG/5ML PO SOLN: 30.0000 mL | ORAL | Status: DC | PRN

## 2014-10-07 MED ORDER — ZOLPIDEM TARTRATE 5 MG PO TABS
5.0000 mg | ORAL_TABLET | Freq: Once | ORAL | Status: AC
  Administered 2014-10-07: 5 mg via ORAL
  Filled 2014-10-07: qty 1

## 2014-10-07 MED ORDER — ONDANSETRON HCL 4 MG/2ML IJ SOLN
4.0000 mg | Freq: Four times a day (QID) | INTRAMUSCULAR | Status: DC | PRN
  Administered 2014-10-08: 4 mg via INTRAVENOUS
  Filled 2014-10-07: qty 2

## 2014-10-07 MED ORDER — OXYTOCIN 40 UNITS IN LACTATED RINGERS INFUSION - SIMPLE MED
62.5000 mL/h | INTRAVENOUS | Status: DC
Start: 2014-10-07 — End: 2014-10-10
  Filled 2014-10-07: qty 1000

## 2014-10-07 MED ORDER — FLEET ENEMA 7-19 GM/118ML RE ENEM: 1.0000 | ENEMA | RECTAL | Status: DC | PRN

## 2014-10-07 MED ORDER — LIDOCAINE HCL (PF) 1 % IJ SOLN
30.0000 mL | INTRAMUSCULAR | Status: AC | PRN
  Administered 2014-10-10: 30 mL via SUBCUTANEOUS
  Filled 2014-10-07: qty 30

## 2014-10-07 MED ORDER — TERBUTALINE SULFATE 1 MG/ML IJ SOLN: 0.2500 mg | Freq: Once | INTRAMUSCULAR | Status: AC | PRN

## 2014-10-07 MED ORDER — ACETAMINOPHEN 325 MG PO TABS: 650.0000 mg | ORAL_TABLET | ORAL | Status: DC | PRN

## 2014-10-07 MED ORDER — OXYCODONE-ACETAMINOPHEN 5-325 MG PO TABS
1.0000 | ORAL_TABLET | ORAL | Status: DC | PRN
Start: 2014-10-07 — End: 2014-10-10

## 2014-10-07 NOTE — H&P (Signed)
Marcello FennelHannah Valls is a 24 y.o. female, G1P0 at 40.6 weeks, presenting for IOL. Reports active fetus. Denies ctxs, LOF or VB.   Hx of genital herpes. Last outbreak in Dec. Currently no prodrome sxs. On Valtrex since 36 wks.   Patient Active Problem List   Diagnosis Date Noted  . Normal labor 10/07/2014  . Anxiety state 10/07/2014  . Seasonal allergies 10/07/2014  . BMI 33.0-33.9,adult 10/07/2014  . Anemia 10/07/2014  . Migraines 07/06/2014  . 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   EDC of 10/01/14 was established by sure LMP and that was c/w u/s at 6.4 wks.   Anatomy scan: 18.4 weeks, NORMAL ANATOMY, C/W DATES. HANDS NOT VISUALIZED WELL; anterior placenta.   Additional US evaluations:  6.4 wks: [facility].   7.5 wks f/u: No evidence of SCH. 12.4 wks - first trimester scan: Normal; nasal bone present. 21.4 wks: Single gestation, anatomy now complete, cervix equals 4.2 cm, growth equals 24th percentile.  29.5 wks for S<D: EFW: 2 lbs 13 oz, 13th%tile symmetrical, repeat in 2 weeks to confirm growth maintained, AFI: 12.6 cm, 35th%tile, cx 3.66 cm and closed; anterior placenta. 31.5 wks - f/u: EFW 3lbs10oz (15%), AFI 13.42 cm, symmetrical SGA; reassess in 3 weeks. 34.5 wks - f/u: EFW: 2307g; 5lbs 1 oz +/- 12 oz (24%), Linear growth, AFI 16.5 cm. Repeat at 39 weeks if (S<D) occurs.  37.6 wks - decreased FM: BPP 8/8. 38.5 wks - BPP 8/8 in 9 minutes. Significant prenatal events: Struggled w/ h/as throughout pregnancy. Several meds tried; Fioricet and Codeine most helpful. Consult to neuro placed more than once; pt never scheduled appt. Vomiting at beginning of pregnancy relieved w/ Diclegis; sx returned later and pt Rx'd Phenergan. MAU visits: 1) 26.6 wks for W/U FOR LEAKING/DISCHARGE AND CRAMPING. In office STERILE SPEC NEGATIVE FERN, NITRAZINE, AND NO POOLING. FFN DONE. WET PREP = + LEUKORRHEA. NST--NON-REACTIVE, WITH  SCATTERED MILD VARIABLES, ONE MODERATE VARIABLE. UTERINE IRRITABILITY - u/s and abdominal u/s normal (Procardia Rx'd prn 1 wk later during office visit); 2) 32 1/7 wks for spotting, vaginal d/c and LUQ pain; work-up neg; Protonix Rx'd for LUQ pain; 3) 10/01/14 for migraine h/a - relieved w/ Percocet, Magnesium, Benadryl and Reglan; w/u neg for pre-e.  Back spasms at 28.4 wks; Rx'd Flexeril. Herpes outbreak in Dec 2015 (28.6 wks) - Rx'd Valtrex and Zovirax; also noted to be size less than dates (u/s results listed above). Experienced decreased FM at 37.5 wks; had Cat 1 FHRT and normal BPP. BPP normal one week later. Last evaluation: Office, 10/06/14 @ 40.5 wks by V. Emilee HeroLatham, CNM. Cvx unfavorable at 1/50/-2, vtx, posterior. BP 112/74.  OB History    Gravida Para Term Preterm AB TAB SAB Ectopic Multiple Living   1              Past Medical History  Diagnosis Date  . Allergy   . Migraine   . Seasonal allergies   . Chicken pox     during childhood  . Herpes simplex     type 2 infection  . UTI (lower urinary tract infection)   . Preterm labor   . Ovarian cyst   . Anemia   . Anxiety     last time a problem 6 mos ago no meds   Past Surgical History  Procedure Laterality Date  . Hernia repair      umbilical   Family History: family  history includes Asthma in her sister; CVA in her paternal grandfather; Diabetes in her paternal grandfather; Diabetes type I in her paternal grandfather; Hearing loss in her sister; Heart disease in her paternal grandfather; Hirschsprung's disease in her other; Other in her maternal aunt, maternal grandfather, and maternal grandmother; Stroke in her paternal grandfather.Malignant tumor of breast in her maternal aunt, malignant tumor of lung in her MGM, malignant tumor of colon in her MGF and Hirschsprung's disease in her paternal first cousin. Social History:  reports that she quit smoking about 9 months ago. Her smoking use included Cigarettes. She has a .5 pack-year  smoking history. She has never used smokeless tobacco. She reports that she does not drink alcohol or use illicit drugs. Pt is a single Caucasian female with a 12th grade education who works as a Nutritional therapist. FOB Al Calloway is present and supportive. Declined both flu and Tdap.   Prenatal Transfer Tool  Maternal Diabetes: No Genetic Screening: Normal first trimester screen Maternal Ultrasounds/Referrals: Normal  Fetal Ultrasounds or other Referrals:  None Maternal Substance Abuse:  No Significant Maternal Medications:  Meds include: Other: Valtrex 500 mg bid, Once daily FE, PNV, Fioricet w/ Codeine prn, daily Phenergan Significant Maternal Lab Results: Lab values include: Group B Strep negative  TDAP: Declined Flu: Declined  ROS: +FM, no ctxs, no LOF, no VB.  No Known Allergies   Dilation: 1 Effacement (%): 50 Station: -2 Exam by:: BShella Spearing RN Blood pressure 138/74, pulse 95, temperature 98 F (36.7 C), temperature source Oral, resp. rate 16, height  (1.626 m), weight 196 lb (88.905 kg), last menstrual period 12/24/2013.  Chest clear Heart RRR without murmur Abd gravid, soft, NT, FH appropriate for this GA  Pelvic: 1/50/-2/firm/posterior; cephalic by VE and Leopolds. Speculum exam performed and revealed no lesions in vagina or on cervix. No perianal lesions and none to external genitalia or vulva.  EFW: 6 3/4 lbs Bishop score: 2 Ext: Trace edema bilaterally  FHR: Category 1 UCs: None  Prenatal labs: ABO, Rh: B+ (02/24/14) Antibody: Neg (02/24/14) Rubella:  Immune RPR: NR HBsAg: Neg HIV: Neg GBS: Neg (09/10/14) Sickle cell/Hgb electrophoresis: NA Pap: Normal in 2014 at another practice per pt report GC:  Neg on 02/24/14 & 09/10/14 Chlamydia: Neg on 02/24/14 & 09/10/14 Genetic screenings: Neg first trimester screen Glucola: Normal at 119 Other: UTI on 03/09/14; treated w/ Macrobid. TOC neg on 04/06/14. Postive fFN on 07/01/14; received BMZ on 07/01/14 and  07/02/14 Hgb 13.5 at NOB, 10.6 at 28 weeks  Assessment: IUP at 40.6 wks IOL - full term pregnancy  GBS neg HSV 2; on Valtrex since 36 wks Migraines (only during pregnancy) H/O anxiety - no meds Elevated BMI (33.5) Unfavorable cvx  Plan: Admit to Birthing Suite per consult with Dr. Sallye Ober as attending MD. Routine [facility] orders. Pain med/epidural prn. Reviewed R&B of induction with patient, including need for serial induction, risk of C/S, need for further intervention. Patient understands risks and agrees with proceeding.  Discussed types of induction to include Cytotec, Foley bulb, AROM and Pitocin. In light of unfavorable cvx, will proceed w/ Cytotec pv. Discussed w/ Dr. Sallye Ober who concurs with plan. If ripening noted in a.m., will place FB.  Robyne Askew, MS 10/07/2014, 21:00 PM

## 2014-10-07 NOTE — Telephone Encounter (Signed)
Preadmission screen  

## 2014-10-08 LAB — RPR: RPR: NONREACTIVE

## 2014-10-08 MED ORDER — OXYTOCIN 40 UNITS IN LACTATED RINGERS INFUSION - SIMPLE MED: 1.0000 m[IU]/min | INTRAVENOUS | Status: DC

## 2014-10-08 MED ORDER — TERBUTALINE SULFATE 1 MG/ML IJ SOLN: 0.2500 mg | Freq: Once | INTRAMUSCULAR | Status: AC | PRN

## 2014-10-08 MED ORDER — OXYTOCIN 40 UNITS IN LACTATED RINGERS INFUSION - SIMPLE MED
1.0000 m[IU]/min | INTRAVENOUS | Status: DC
  Administered 2014-10-08: 1 m[IU]/min via INTRAVENOUS

## 2014-10-08 MED ORDER — INFLUENZA VAC SPLIT QUAD 0.5 ML IM SUSY: 0.5000 mL | PREFILLED_SYRINGE | INTRAMUSCULAR | Status: DC

## 2014-10-08 NOTE — Progress Notes (Signed)
  Subjective: More uncomfortable with UCs, tearful.    Objective: BP 122/86 mmHg  Pulse 96  Temp(Src) 97.9 F (36.6 C) (Oral)  Resp 20  Ht 5\' 4"  (1.626 m)  Wt 196 lb (88.905 kg)  BMI 33.63 kg/m2  LMP 12/24/2013      FHT: Category 2--series of mild/moderate variables, with baseline to 160--pitocin stopped. UC:   irregular, every 2-4 minutes, difficult to trace, able to palpate SVE:   Dilation: 1.5 Effacement (%): 50 Station: -1 Exam by:: Barbara Vargas CNM  Difficult exam due to patient discomfort--tight outlet.   Pitocin at 10 mu/min  Assessment:  Induction for postdates Unfavorable cervix  Plan: Per consult with Dr. Estanislado Pandyivard, will consider cytotech after FHR settles down. Support to patient for frustration regarding slow process of induction.   Barbara Vargas, Barbara Vargas CNM 10/08/2014, 10:54 PM

## 2014-10-08 NOTE — Progress Notes (Addendum)
Labor Progress  Subjective: Comfortable, no complaints  Objective: BP 124/69 mmHg  Pulse 104  Temp(Src) 97.9 F (36.6 C) (Oral)  Resp 18  Ht 5\' 4"  (1.626 m)  Wt 196 lb (88.905 kg)  BMI 33.63 kg/m2  LMP 12/24/2013     FHT: 130, + accel, no decel, moderate variability CTX:  occasional Uterus gravid, soft non tender SVE:  Dilation: 1.5 Effacement (%): 50 Station: -1 Exam by:: Lorretta Harp. Brown RNC    Assessment:  IUP at 41.0 weeks NICHD: Category 1 Membranes:  intact Labor progress: IOL GBS: negative    Plan: Continue labor plan Continuous/intermittent monitoring Ambulate Frequent position changes to facilitate fetal rotation and descent. Will reassess with cervical exam at 1500 or earlier if necessary       Venus Standard, CNM, MSN 10/08/2014. 1:41 PM   I saw and examined patient at bedside and agree with above findings assessment and plan.  Dr. Sallye OberKulwa.

## 2014-10-08 NOTE — Progress Notes (Signed)
  Subjective: Was given 5 mg Ambien last evening for sleep, but med ineffective. Pt reports taking daily Phenergan, Fioricet w/ Codeine and a nightly sleep aid (unsure of name).  Objective: BP 117/63 mmHg  Pulse 93  Temp(Src) 98.4 F (36.9 C) (Oral)  Resp 16  Ht 5\' 4"  (1.626 m)  Wt 196 lb (88.905 kg)  BMI 33.63 kg/m2  LMP 12/24/2013     Today's Vitals   10/08/14 0330 10/08/14 0412 10/08/14 0500 10/08/14 0601  BP: 117/63     Pulse: 93     Temp: 98.4 F (36.9 C)     TempSrc: Oral     Resp: 16     Height:      Weight:      PainSc: 0-No pain 0-No pain 0-No pain Asleep   FHT: BL 120 w/ moderate variability, +accels, no decels UC:   irregular, every 6+ minutes SVE:   Dilation: 1 Effacement (%): 50 Station: -2 Exam by:: Manning CharityB. Boyer RN  Cytotec #1 placed at 2155 Cytotec #2 placed at 0233 Cytotec #3 placed at 0657  Suspect Ambien ineffective due to tolerance to other meds listed above. Was able to fall asleep a little while ago.  Assessment:  IUP at 41.0 wks, IOL GBS neg Unchanged cvx  Plan: Defer further management to oncoming CNM. Report off to both CNM and MD.   Sherre ScarletWILLIAMS, Barbara Embree CNM 10/08/2014, 0700

## 2014-10-08 NOTE — Progress Notes (Signed)
Labor Progress  Subjective: Starting to feel ctx  Objective: BP 124/69 mmHg  Pulse 104  Temp(Src) 97.9 F (36.6 C) (Oral)  Resp 18  Ht 5\' 4"  (1.626 m)  Wt 196 lb (88.905 kg)  BMI 33.63 kg/m2  LMP 12/24/2013     FHT: 130, moderate variability, + accel, no decel CTX:  irregular, every 3-7 minutes Uterus gravid, soft non tender SVE:  Dilation: 1.5 Effacement (%): 50 Station: -1 Exam by:: Lorretta Harp. Brown RNC  softer  Assessment:  IUP at 41.0 weeks NICHD: Category 1 Membranes:  intact Labor progress: IOL GBS: negative Failed attempt at foley bulb placement, pt could not tolerate procedure    Plan: Continue labor plan Continuous monitoring Rest/Ambulate Frequent position changes to facilitate fetal rotation and descent. Will reassess with cervical exam at 1800 or earlier if necessary Start pitocin per protocol 1x1     Barbara Vargas, CNM, MSN 10/08/2014. 3:13 PM

## 2014-10-08 NOTE — Progress Notes (Signed)
Labor Progress  Subjective: Comfortable, no complaints.  "trying to get rest"  Objective: BP 111/55 mmHg  Pulse 94  Temp(Src) 98.4 F (36.9 C) (Oral)  Resp 20  Ht 5\' 4"  (1.626 m)  Wt 196 lb (88.905 kg)  BMI 33.63 kg/m2  LMP 12/24/2013     FHT: 130, moderate variability, no decel, + accel CTX:  none Uterus gravid, soft non tender SVE:  Dilation: 1.5 Effacement (%): 50 Station: -2 Exam by:: Manning CharityB. Boyer RN   Assessment:  IUP at 41.0 weeks NICHD: Category Membranes:  intact Labor progress: IOL GBS: negative    Plan: Continue labor plan Continuous monitoring Frequent position changes to facilitate fetal rotation and descent. Will reassess with cervical exam at 1100 or earlier if necessary       Barbara Vargas, CNM, MSN 10/08/2014. 9:43 AM

## 2014-10-08 NOTE — Progress Notes (Addendum)
Subjective: Resting comfortably--texting, FOB at bedside.  Aware of mild cramping.  Objective: BP 122/86 mmHg  Pulse 96  Temp(Src) 97.9 F (36.6 C) (Oral)  Resp 20  Ht 5\' 4"  (1.626 m)  Wt 196 lb (88.905 kg)  BMI 33.63 kg/m2  LMP 12/24/2013      FHT: Category 1 UC:   Irregular, mild SVE:  1-2, 50%, vtx, -1 at 6:45 by VS  Pitocin at 6 mu/min--now going up by 2 per Dr. Sallye OberKulwa order  Previous attempt to place foley bulb at 3:13p, cervix too posterior, patient too uncomfortable.  Assessment:  IUP at 41 weeks Induction, unfavorable cervix GBS negative  Plan: Continue current care at present. Re-evaluate prn.  Nigel BridgemanLATHAM, Barbara Vargas, [place] 10/08/2014, 7:45p

## 2014-10-08 NOTE — Progress Notes (Signed)
Labor Progress  Subjective: No complaints  Objective: BP 119/65 mmHg  Pulse 102  Temp(Src) 98.2 F (36.8 C) (Oral)  Resp 18  Ht 5\' 4"  (1.626 m)  Wt 196 lb (88.905 kg)  BMI 33.63 kg/m2  LMP 12/24/2013     FHT: 140, moderate variability, + accel, no decel CTX:  irregular, every 2-8 minutes Uterus gravid, soft non tender SVE:  Dilation: 1.5 Effacement (%): 50 Station: -1 Exam by:: Barbara Vargas, CNM Pitocin at 95mUn/min  Assessment:  IUP at 41.0 weeks NICHD: Category Membranes:  intact Labor progress: IOL Pitocin Augmentation GBS: negative   Plan: Continue labor plan Continuous monitoring Frequent position changes to facilitate fetal rotation and descent. Continue pitocin per protocol 2 x 2      Barbara Vargas, CNM, MSN 10/08/2014. 6:46 PM

## 2014-10-09 MED ORDER — ZOLPIDEM TARTRATE 5 MG PO TABS
5.0000 mg | ORAL_TABLET | Freq: Every evening | ORAL | Status: DC | PRN
  Administered 2014-10-09: 5 mg via ORAL
  Filled 2014-10-09: qty 1

## 2014-10-09 MED ORDER — OXYTOCIN 40 UNITS IN LACTATED RINGERS INFUSION - SIMPLE MED: 1.0000 m[IU]/min | INTRAVENOUS | Status: DC

## 2014-10-09 MED ORDER — OXYTOCIN 40 UNITS IN LACTATED RINGERS INFUSION - SIMPLE MED
1.0000 m[IU]/min | INTRAVENOUS | Status: DC
  Administered 2014-10-09 (×2): 2 m[IU]/min via INTRAVENOUS
  Filled 2014-10-09: qty 1000

## 2014-10-09 MED ORDER — TERBUTALINE SULFATE 1 MG/ML IJ SOLN: 0.2500 mg | Freq: Once | INTRAMUSCULAR | Status: AC | PRN

## 2014-10-09 MED ORDER — SALINE SPRAY 0.65 % NA SOLN
1.0000 | NASAL | Status: DC | PRN
  Filled 2014-10-09: qty 44

## 2014-10-09 MED ORDER — BUTALBITAL-APAP-CAFFEINE 50-325-40 MG PO TABS
1.0000 | ORAL_TABLET | Freq: Two times a day (BID) | ORAL | Status: DC | PRN
  Filled 2014-10-09: qty 1

## 2014-10-09 NOTE — Progress Notes (Signed)
Labor Progress  Subjective: Getting discouraged with the labor progress  Objective: BP 120/72 mmHg  Pulse 95  Temp(Src) 98.5 F (36.9 C) (Oral)  Resp 16  Ht 5\' 4"  (1.626 m)  Wt 196 lb (88.905 kg)  BMI 33.63 kg/m2  LMP 12/24/2013     FHT:  135, moderate variability, + accel, no current decel, 1 variable decel at 1308 CTX:  regular, every 2 minutes mild Uterus gravid, soft non tender SVE:  Dilation: 1.5 Effacement (%): 50 Station: -1 Exam by:: V Laretha Luepke CNM Pitocin at 236mUn/min  Assessment:  IUP at 41.1 weeks NICHD: Category 1 Membranes:  intact Labor progress: IOL Pitocin Augmentation WUJ:[id number]:negative    Plan: Continue labor plan Continuous monitoring Frequent position changes to facilitate fetal rotation and descent. Will reassess with cervical exam at 1700 or earlier if necessary Hold pitocin at 6mu  Dr Normand Sloopillard aware     Adelina MingsVenus Dandy Lazaro, CNM, MSN 10/09/2014. 2:14 PM

## 2014-10-09 NOTE — Progress Notes (Signed)
  Subjective: Patient feeling less contractions now.  Appears less anxious, but is fatigued.  Objective: BP 122/86 mmHg  Pulse 96  Temp(Src) 97.9 F (36.6 C) (Oral)  Resp 20  Ht 5\' 4"  (1.626 m)  Wt 196 lb (88.905 kg)  BMI 33.63 kg/m2  LMP 12/24/2013      FHT: Category 1 UC:   Occasional, mild SVE:   Dilation: 1.5 Effacement (%): 50 Station: -1 Exam by:: Manfred ArchV. Ledia Hanford CNM  Cytotech 25 mcg placed in posterior fornix.   Assessment:  Induction for postdates Unfavorable cervix GBS negative  Plan: Ambien now. Repeat Cytotech in 4 hours prn.  Nigel BridgemanLATHAM, Luie Laneve CNM 10/09/2014, 12:09 AM

## 2014-10-09 NOTE — Progress Notes (Signed)
Labor Progress  Subjective: C/o HA.   Objective: BP 110/65 mmHg  Pulse 104  Temp(Src) 98.5 F (36.9 C) (Oral)  Resp 16  Ht 5\' 4"  (1.626 m)  Wt 196 lb (88.905 kg)  BMI 33.63 kg/m2  LMP 12/24/2013     FHT: 140, moderate variability, + accel, no decel CTX:  regular, every 2-4 minutes Uterus gravid, soft non tender SVE:  Dilation: 1.5 Effacement (%): 50 Station: -1 Exam by:: V Saahas Hidrogo CNM Soft,  posterior   Assessment:  IUP at 41.1 weeks NICHD: Category 1 Membranes:  intact Labor progress: IOL SP Cytotec x4 SP Pitocin Augmentation Cytotec x4 GBS: negative    Plan: Continue labor plan Continuous monitoring Fioricet for HA Consult with Dr Maureen Ralphsillard    Barbara Vargas, CNM, MSN 10/09/2014. 9:42 AM

## 2014-10-09 NOTE — Progress Notes (Signed)
  Subjective: Has slept at intervals--appears less anxious.  Aware of some UCs, but not uncomfortable.  Objective: BP 121/60 mmHg  Pulse 81  Temp(Src) 97.5 F (36.4 C) (Oral)  Resp 18  Ht 5\' 4"  (1.626 m)  Wt 196 lb (88.905 kg)  BMI 33.63 kg/m2  LMP 12/24/2013      FHT: Category 1 UC:   Occasional, mild SVE:   Dilation: 1.5 Effacement (%): 50 Station: -1 Exam by:: Barbara Vargas CNM--still posterior. 2nd dose Cytotech 25 mcg placed in posterior fornix   Assessment:  Induction for postdates Unfavorable cervix  Plan: Will continue to observe. Re-evaluate around 0805.  Barbara Vargas, Barbara Vargas CNM 10/09/2014, 4:28 AM

## 2014-10-09 NOTE — Progress Notes (Signed)
Barbara Vargas MRN: [id number]  Subjective: -Care Assumed of 23y.o. G1P0 at 41.1weeks who is Day 2 of IOL for postdates. Plan is for patient to receive Primary C/S if no cervical change.  Patient understands and agrees with this plan. Patient resting in bed. Reports no perception of contractions.   Objective: BP 121/64 mmHg  Pulse 82  Temp(Src) 98.4 F (36.9 C) (Oral)  Resp 18  Ht 5\' 4"  (1.626 m)  Wt 196 lb (88.905 kg)  BMI 33.63 kg/m2  LMP 12/24/2013     FHT: 125 bpm, Mod Var, -Decels, +Accels UC: Unable to graph, palpates moderate   SVE:   Dilation: 4 Effacement (%): 50 Station: -1 Exam by:: J.Apollo Timothy, CNM Membranes: Intact Pitocin:356mUn/min  Assessment:  IUP at 41.1wks Cat I FT  PostDates IOL Pitocin Titration Early Labor  Plan: -Discussed cervical change, patient wishes to proceed with induction process -Discussed r/b of induction including delivery via c/s -Start to titrate pitocin at 112mUn/min every hour -Okay for epidural when desired -Continue other mgmt as ordered  [name],MSN, CNM 10/09/2014, 9:10 PM

## 2014-10-09 NOTE — Progress Notes (Signed)
Labor Progress  Subjective: Pt very flustrated with progress.  We reviewed the 12 hour pitocin plan  Objective: BP 139/75 mmHg  Pulse 82  Temp(Src) 98.6 F (37 C) (Oral)  Resp 16  Ht 5\' 4"  (1.626 m)  Wt 196 lb (88.905 kg)  BMI 33.63 kg/m2  LMP 12/24/2013     FHT: 140, moderate variability, + accel, no decel CTX:  None, irritability observed Uterus gravid, soft non tender SVE:  Dilation: 1.5 Effacement (%): 50 Station: -1 Exam by:: V Henri Guedes CNM Pitocin at 546mUn/min   Assessment:  IUP at 41.1 weeks NICHD: Category 1 Membranes:  intact Labor progress: Inadquate labor pattern Pitocin Augmentation GBS: negative   Plan: Continue labor plan Continuous/intermittent monitoring Rest Frequent position changes to facilitate fetal rotation and descent Holding pitocin at 6mu Proceed with CS in no cervical change by 9pm      Barbara Vargas, CNM, MSN 10/09/2014. 5:30 PM

## 2014-10-09 NOTE — Progress Notes (Signed)
Reviewed options with pt 1) foley bulb 2) pitocin low dose  3) CS  Pt does not want to try a foley bulb, she request pitocin      Vertex by US Start pitocin at 2 increase x 2 upto 6mu and hold for up to 12 hours

## 2014-10-10 ENCOUNTER — Inpatient Hospital Stay (HOSPITAL_COMMUNITY): Payer: Medicaid Other | Admitting: Anesthesiology

## 2014-10-10 ENCOUNTER — Encounter (HOSPITAL_COMMUNITY): Payer: Self-pay

## 2014-10-10 MED ORDER — OXYCODONE-ACETAMINOPHEN 5-325 MG PO TABS
1.0000 | ORAL_TABLET | ORAL | Status: DC | PRN
  Administered 2014-10-11 (×2): 1 via ORAL
  Filled 2014-10-10 (×2): qty 1

## 2014-10-10 MED ORDER — PHENYLEPHRINE 40 MCG/ML (10ML) SYRINGE FOR IV PUSH (FOR BLOOD PRESSURE SUPPORT)
80.0000 ug | PREFILLED_SYRINGE | INTRAVENOUS | Status: DC | PRN
  Filled 2014-10-10: qty 2

## 2014-10-10 MED ORDER — DIPHENHYDRAMINE HCL 25 MG PO CAPS: 25.0000 mg | ORAL_CAPSULE | Freq: Four times a day (QID) | ORAL | Status: DC | PRN

## 2014-10-10 MED ORDER — DIBUCAINE 1 % RE OINT: 1.0000 "application " | TOPICAL_OINTMENT | RECTAL | Status: DC | PRN

## 2014-10-10 MED ORDER — OXYCODONE-ACETAMINOPHEN 5-325 MG PO TABS
2.0000 | ORAL_TABLET | ORAL | Status: DC | PRN
  Administered 2014-10-11 – 2014-10-12 (×5): 2 via ORAL
  Filled 2014-10-10 (×5): qty 2

## 2014-10-10 MED ORDER — BENZOCAINE-MENTHOL 20-0.5 % EX AERO
1.0000 "application " | INHALATION_SPRAY | CUTANEOUS | Status: DC | PRN
  Administered 2014-10-10 – 2014-10-12 (×2): 1 via TOPICAL
  Filled 2014-10-10 (×2): qty 56

## 2014-10-10 MED ORDER — LANOLIN HYDROUS EX OINT
TOPICAL_OINTMENT | CUTANEOUS | Status: DC | PRN
Start: 2014-10-10 — End: 2014-10-12

## 2014-10-10 MED ORDER — MISOPROSTOL 200 MCG PO TABS
1000.0000 ug | ORAL_TABLET | Freq: Once | ORAL | Status: AC
  Administered 2014-10-10: 1000 ug via RECTAL

## 2014-10-10 MED ORDER — WITCH HAZEL-GLYCERIN EX PADS: 1.0000 "application " | MEDICATED_PAD | CUTANEOUS | Status: DC | PRN

## 2014-10-10 MED ORDER — EPHEDRINE 5 MG/ML INJ
10.0000 mg | INTRAVENOUS | Status: DC | PRN
  Filled 2014-10-10: qty 2

## 2014-10-10 MED ORDER — ONDANSETRON HCL 4 MG/2ML IJ SOLN
4.0000 mg | INTRAMUSCULAR | Status: DC | PRN
Start: 2014-10-10 — End: 2014-10-12

## 2014-10-10 MED ORDER — ZOLPIDEM TARTRATE 5 MG PO TABS
5.0000 mg | ORAL_TABLET | Freq: Every evening | ORAL | Status: DC | PRN
Start: 2014-10-10 — End: 2014-10-12

## 2014-10-10 MED ORDER — DIPHENHYDRAMINE HCL 50 MG/ML IJ SOLN: 12.5000 mg | INTRAMUSCULAR | Status: DC | PRN

## 2014-10-10 MED ORDER — SIMETHICONE 80 MG PO CHEW: 80.0000 mg | CHEWABLE_TABLET | ORAL | Status: DC | PRN

## 2014-10-10 MED ORDER — MISOPROSTOL 200 MCG PO TABS
ORAL_TABLET | ORAL | Status: AC
  Administered 2014-10-10: 1000 ug via RECTAL
  Filled 2014-10-10: qty 5

## 2014-10-10 MED ORDER — IBUPROFEN 600 MG PO TABS
600.0000 mg | ORAL_TABLET | Freq: Four times a day (QID) | ORAL | Status: DC
  Administered 2014-10-10 – 2014-10-12 (×8): 600 mg via ORAL
  Filled 2014-10-10 (×8): qty 1

## 2014-10-10 MED ORDER — PHENYLEPHRINE 40 MCG/ML (10ML) SYRINGE FOR IV PUSH (FOR BLOOD PRESSURE SUPPORT)
80.0000 ug | PREFILLED_SYRINGE | INTRAVENOUS | Status: DC | PRN
  Filled 2014-10-10: qty 20
  Filled 2014-10-10: qty 2

## 2014-10-10 MED ORDER — FENTANYL 2.5 MCG/ML BUPIVACAINE 1/10 % EPIDURAL INFUSION (WH - ANES)
INTRAMUSCULAR | Status: DC | PRN
  Administered 2014-10-10: 14 mL/h via EPIDURAL

## 2014-10-10 MED ORDER — LACTATED RINGERS IV SOLN
500.0000 mL | Freq: Once | INTRAVENOUS | Status: AC
  Administered 2014-10-10: 500 mL via INTRAVENOUS

## 2014-10-10 MED ORDER — FENTANYL 2.5 MCG/ML BUPIVACAINE 1/10 % EPIDURAL INFUSION (WH - ANES)
14.0000 mL/h | INTRAMUSCULAR | Status: DC | PRN
  Administered 2014-10-10 (×3): 14 mL/h via EPIDURAL
  Filled 2014-10-10 (×3): qty 125

## 2014-10-10 MED ORDER — LIDOCAINE HCL (PF) 1 % IJ SOLN
INTRAMUSCULAR | Status: DC | PRN
  Administered 2014-10-10 (×2): 4 mL

## 2014-10-10 MED ORDER — PRENATAL MULTIVITAMIN CH
1.0000 | ORAL_TABLET | Freq: Every day | ORAL | Status: DC
  Administered 2014-10-11 – 2014-10-12 (×2): 1 via ORAL
  Filled 2014-10-10 (×2): qty 1

## 2014-10-10 MED ORDER — ONDANSETRON HCL 4 MG PO TABS: 4.0000 mg | ORAL_TABLET | ORAL | Status: DC | PRN

## 2014-10-10 MED ORDER — INFLUENZA VAC SPLIT QUAD 0.5 ML IM SUSY
0.5000 mL | PREFILLED_SYRINGE | INTRAMUSCULAR | Status: AC
  Administered 2014-10-11: 0.5 mL via INTRAMUSCULAR
  Filled 2014-10-10: qty 0.5

## 2014-10-10 MED ORDER — TETANUS-DIPHTH-ACELL PERTUSSIS 5-2.5-18.5 LF-MCG/0.5 IM SUSP
0.5000 mL | Freq: Once | INTRAMUSCULAR | Status: AC
  Administered 2014-10-11: 0.5 mL via INTRAMUSCULAR
  Filled 2014-10-10: qty 0.5

## 2014-10-10 MED ORDER — SENNOSIDES-DOCUSATE SODIUM 8.6-50 MG PO TABS
2.0000 | ORAL_TABLET | ORAL | Status: DC
  Administered 2014-10-11: 2 via ORAL
  Filled 2014-10-10: qty 2

## 2014-10-10 NOTE — Progress Notes (Signed)
  Subjective: No sensation of pressure--previous right side pain resolved after PCAs.  Objective: BP 122/67 mmHg  Pulse 94  Temp(Src) 98.3 F (36.8 C) (Oral)  Resp 18  Ht 5\' 4"  (1.626 m)  Wt 196 lb (88.905 kg)  BMI 33.63 kg/m2  SpO2 100%  LMP 12/24/2013   Total I/O In: 800 [Other:800] Out: -   FHT: Category 1 UC:   regular, every 2-3 minutes SVE:   Dilation: 10 Effacement (%): 100 Station: +2 Exam by:: Emilee HeroLatham CNM  MSF noted Vtx has descended significantly since 7am exam.   Patient with no sensation of pressure, minimal descent with trial push. Pitocin at 16 mu/min IUPC essentially extruded by vtx--replaced, but still not tracing UCs well.  Assessment:  2nd stage labor, good fetal descent. No urge to push  Plan: Continue to labor down x 1 hour, with patient in semi-Fowler's position.  Re-evaluate at that time for effectiveness of pushing. Dr. Normand Sloopillard updated.  Nigel BridgemanLATHAM, Savior Himebaugh CNM 10/10/2014, 10:17 AM

## 2014-10-10 NOTE — Anesthesia Preprocedure Evaluation (Signed)
Anesthesia Evaluation  Patient identified by MRN, date of birth, ID band Patient awake    Reviewed: Allergy & Precautions, NPO status , Patient's Chart, lab work & pertinent test results  History of Anesthesia Complications Negative for: history of anesthetic complications  Airway Mallampati: II  TM Distance: >3 FB Neck ROM: Full    Dental no notable dental hx. (+) Dental Advisory Given   Pulmonary former smoker,  breath sounds clear to auscultation  Pulmonary exam normal       Cardiovascular negative cardio ROS  Rhythm:Regular Rate:Normal     Neuro/Psych  Headaches, negative psych ROS   GI/Hepatic negative GI ROS, Neg liver ROS,   Endo/Other  negative endocrine ROS  Renal/GU negative Renal ROS  negative genitourinary   Musculoskeletal negative musculoskeletal ROS (+)   Abdominal   Peds negative pediatric ROS (+)  Hematology  (+) anemia ,   Anesthesia Other Findings   Reproductive/Obstetrics (+) Pregnancy                             Anesthesia Physical Anesthesia Plan  ASA: II  Anesthesia Plan: Epidural   Post-op Pain Management:    Induction:   Airway Management Planned:   Additional Equipment:   Intra-op Plan:   Post-operative Plan:   Informed Consent: I have reviewed the patients History and Physical, chart, labs and discussed the procedure including the risks, benefits and alternatives for the proposed anesthesia with the patient or authorized representative who has indicated his/her understanding and acceptance.   Dental advisory given  Plan Discussed with: CRNA  Anesthesia Plan Comments:         Anesthesia Quick Evaluation

## 2014-10-10 NOTE — Progress Notes (Signed)
Barbara FennelHannah Vargas MRN: [id number]  Subjective: -Patient resting in bed on left side.  Reports comfort with epidural.  FOB at bedside.   Objective: BP 137/71 mmHg  Pulse 71  Temp(Src) 98.4 F (36.9 C) (Oral)  Resp 18  Ht 5\' 4"  (1.626 m)  Wt 196 lb (88.905 kg)  BMI 33.63 kg/m2  SpO2 100%  LMP 12/24/2013     FHT: 130 bpm, Mod Var, +Prolonged Decel, +Accels UC: None graphed, palpates mild to moderate   SVE:   Dilation: 4 Effacement (%): 50 Station: -1 Exam by:: J.Shelia Magallon, CNM Membranes: AROM of small amt MSF at 0340 Pitocin: 1614mUn/min IUPC inserted  Assessment:  IUP at 41.2wks Cat II FT  PostDates IOL Pitocin Titration Amniotomy  Plan: -Discussed r/b of amniotomy and IUPC insertion, patient verbalizes understanding and agrees -Amniotomy performed and IUPC inserted without issues -Cat II FT resolved with position change and fluid bolus -Will  titrate pitocin based on MVUs -No questions or concerns -Continue other mgmt as ordered  Barbara Vargas LYNN,MSN, CNM 10/10/2014, 3:44 AM

## 2014-10-10 NOTE — Progress Notes (Signed)
  Subjective: Feeling pain on right side--no urge to push.  Objective: BP 128/100 mmHg  Pulse 86  Temp(Src) 98.5 F (36.9 C) (Oral)  Resp 18  Ht 5\' 4"  (1.626 m)  Wt 196 lb (88.905 kg)  BMI 33.63 kg/m2  SpO2 100%  LMP 12/24/2013      Filed Vitals:   10/10/14 0601 10/10/14 [id number] 10/10/14 0702 10/10/14 0731  BP: 122/59 140/86 114/85 128/100  Pulse: 86 106 98 86  Temp: 98.5 F (36.9 C)     TempSrc: Oral     Resp: 18 18 18 18   Height:      Weight:      SpO2:        FHT: Category 1 UC:   regular, every 1-3 minutes SVE:   Dilation: 10 Effacement (%): 100 Station: -1 Exam by:: J Emly CNM  MVUs 130 Pitocin at 16 mu/min  Assessment:  2nd stage labor, but no urge to push. Inadequate labor, but cervical change  Plan: Continue to work with epidural and positioning to aid in comfort and descent. Continue pitocin augmentation.  Nigel BridgemanLATHAM, Chancy Smigiel CNM 10/10/2014, 7:50 AM

## 2014-10-10 NOTE — Progress Notes (Signed)
Barbara Vargas MRN: [id number]  Subjective: -Patient tearful and reports right sided lower abdominal pain.    Objective: BP 140/86 mmHg  Pulse 106  Temp(Src) 98.5 F (36.9 C) (Oral)  Resp 18  Ht 5\' 4"  (1.626 m)  Wt 196 lb (88.905 kg)  BMI 33.63 kg/m2  SpO2 100%  LMP 12/24/2013     FHT: 130  bpm, Mod Var, +Late Decels, +Accels UC:  Q1-794min, MVUs 140mmHg SVE:   Dilation: 10 Effacement (%): 100 Station: -1 Exam by:: J Aleeya Veitch Barbara Vargas Membranes: AROM x 3hrs Pitocin: 2316mUn/min  Assessment:  IUP at 41.2wks Cat I FT  2nd Stage Labor  Plan: -Allow to labor down -Position change to promote fetal descent and rotation -Continue other mgmt as ordered -Will report to oncoming provider, V. Latham,Barbara Vargas  Barbara Puett LYNN,Barbara Vargas, Barbara Vargas 10/10/2014, 6:34 AM

## 2014-10-10 NOTE — Anesthesia Procedure Notes (Signed)
Epidural Patient location during procedure: OB Start time: 10/10/2014 2:15 AM  Staffing Anesthesiologist: Felipe DroneJUDD, Giann Obara JENNETTE Performed by: anesthesiologist   Preanesthetic Checklist Completed: patient identified, site marked, surgical consent, pre-op evaluation, timeout performed, IV checked, risks and benefits discussed and monitors and equipment checked  Epidural Patient position: sitting Prep: site prepped and draped and DuraPrep Patient monitoring: continuous pulse ox and blood pressure Approach: midline Location: L3-L4 Injection technique: LOR saline  Needle:  Needle type: Tuohy  Needle gauge: 17 G Needle length: 9 cm and 9 Needle insertion depth: 7 cm Catheter type: closed end flexible Catheter size: 19 Gauge Catheter at skin depth: 12 cm Test dose: negative  Assessment Events: blood not aspirated, injection not painful, no injection resistance, negative IV test and no paresthesia  Additional Notes .Patient identified. Risks/Benefits/Options discussed with patient including but not limited to bleeding, infection, nerve damage, paralysis, failed block, incomplete pain control, headache, blood pressure changes, nausea, vomiting, reactions to medication both or allergic, itching and postpartum back pain. Confirmed with bedside nurse the patient's most recent platelet count. Confirmed with patient that they are not currently taking any anticoagulation, have any bleeding history or any family history of bleeding disorders. Patient expressed understanding and wished to proceed. All questions were answered. Sterile technique was used throughout the entire procedure. Please see nursing notes for vital signs. Test dose was given through epidural catheter and negative prior to continuing to dose epidural or start infusion. Warning signs of high block given to the patient including shortness of breath, tingling/numbness in hands, complete motor block, or any concerning symptoms with  instructions to call for help. Patient was given instructions on fall risk and not to get out of bed. All questions and concerns addressed with instructions to call with any issues or inadequate analgesia.

## 2014-10-11 LAB — CBC
HEMATOCRIT: 28.3 % — AB (ref 36.0–46.0)
Hemoglobin: 9.6 g/dL — ABNORMAL LOW (ref 12.0–15.0)
MCH: 30.4 pg (ref 26.0–34.0)
MCHC: 33.9 g/dL (ref 30.0–36.0)
MCV: 89.6 fL (ref 78.0–100.0)
PLATELETS: 121 10*3/uL — AB (ref 150–400)
RBC: 3.16 MIL/uL — AB (ref 3.87–5.11)
RDW: 14.1 % (ref 11.5–15.5)
WBC: 10.1 10*3/uL (ref 4.0–10.5)

## 2014-10-11 LAB — TYPE AND SCREEN
ABO/RH(D): B POS
ANTIBODY SCREEN: NEGATIVE

## 2014-10-11 MED ORDER — FERROUS SULFATE 325 (65 FE) MG PO TABS
325.0000 mg | ORAL_TABLET | Freq: Two times a day (BID) | ORAL | Status: DC
  Administered 2014-10-11 – 2014-10-12 (×2): 325 mg via ORAL
  Filled 2014-10-11 (×2): qty 1

## 2014-10-11 MED ORDER — METHYLERGONOVINE MALEATE 0.2 MG PO TABS
0.2000 mg | ORAL_TABLET | Freq: Four times a day (QID) | ORAL | Status: AC
  Administered 2014-10-11 (×4): 0.2 mg via ORAL
  Filled 2014-10-11 (×4): qty 1

## 2014-10-11 NOTE — Progress Notes (Signed)
Foley catheter number 14 placed due to inadequate emptying of bladder. Patients labia and surrounding area very edematous. Uterus firm at umbilicus with slight trickle of lochia with massage. Gerrit HeckJessica Emly CNM called and given report with orders given. Patient to start on Methergine protocol.0 clots noted at this time.

## 2014-10-11 NOTE — Plan of Care (Signed)
Problem: Phase I Progression Outcomes Goal: Voiding adequately Outcome: Not Progressing Foley Catheter inserted d/t inadequate emptying of bladder. Tolerated Fairly.

## 2014-10-11 NOTE — Progress Notes (Addendum)
Barbara Vargas   Subjective: Post Partum Day 1 Vaginal delivery, deep vaginal laceration Patient up ad lib, denies syncope or dizziness. Reports consuming regular diet without issues and denies N/V No current issues with bleeding,  reports bleeding is appropriate  Feeding:  bottle Contraceptive plan:   Unsure BMx1  Objective: Temp:  [98.1 F (36.7 C)-99.5 F (37.5 C)] 98.1 F (36.7 C) (03/06 0802) Pulse Rate:  [82-185] 92 (03/06 0802) Resp:  [18-20] 18 (03/06 0802) BP: (89-134)/(39-96) 114/52 mmHg (03/06 0802)  Physical Exam:  General: alert and cooperative Ext: WNL, no edema. No evidence of DVT seen on physical exam. Breast: Soft filling Lungs: CTAB Heart RRR without murmur Abdomen:  Soft, fundus firm, lochia scant, + bowel sounds, non distended, non tender Lochia: appropriate Uterine Fundus: firm Laceration: healing well, no significant drainage Swollen labia    Recent Labs  10/11/14 0557  HGB 9.6*  HCT 28.3*    Assessment S/P Vaginal Delivery-Day 1 Stable  Normal Involution Bottlefeeding SP "constant trickle" resolved, appropriate lochia Methergine series x24hrs Foley in place draining clear yellow urine anemic   Plan: Continue current care as ordered Plan for discharge tomorrow Lactation support Continue ice pack to labia DC foley later this evening Iron supplements  Shawana Knoch, CNM, MSN 10/11/2014, 11:36 AM

## 2014-10-11 NOTE — Anesthesia Postprocedure Evaluation (Signed)
Anesthesia Post Note  Patient: Barbara FennelHannah Michiels  Procedure(s) Performed: * No procedures listed *  Anesthesia type: Epidural  Patient location: Mother/Baby  Post pain: Pain level controlled  Post assessment: Post-op Vital signs reviewed  Last Vitals:  Filed Vitals:   10/11/14 0802  BP: 114/52  Pulse: 92  Temp: 36.7 C  Resp: 18    Post vital signs: Reviewed  Level of consciousness:alert  Complications: No apparent anesthesia complications

## 2014-10-11 NOTE — Progress Notes (Signed)
Subjective: Nurse call reporting patient with extensive swelling of perineum and area surrounding urethra opening.  Nurse also reports patient with constant trickle despite firm uterus.   Objective:  Filed Vitals:   10/10/14 1400 10/10/14 1425 10/10/14 1525 10/10/14 1819  BP:  115/75 123/68 117/70  Pulse:  91 90 100  Temp:  99.5 F (37.5 C) 99 F (37.2 C) 98.9 F (37.2 C)  TempSrc:  Oral Oral Oral  Resp: 20 20 18 18   Height:      Weight:      SpO2:        Assessment: 12hrs Postpartum S/P SVD with Vaginal Laceration Moderate Bleeding  Plan: Insert foley catheter Start methergine series x 24 hours Will reassess patient bleeding and status in morning Continue other mgmt as ordered  Sabas SousJ. Enrique Weiss, CNM 10/11/2014 12:43 AM

## 2014-10-12 MED ORDER — OXYCODONE-ACETAMINOPHEN 5-325 MG PO TABS: 1.0000 | ORAL_TABLET | ORAL | Status: DC | PRN

## 2014-10-12 MED ORDER — NORGESTIMATE-ETH ESTRADIOL 0.25-35 MG-MCG PO TABS: 1.0000 | ORAL_TABLET | Freq: Every day | ORAL | Status: DC

## 2014-10-12 MED ORDER — IBUPROFEN 600 MG PO TABS: 600.0000 mg | ORAL_TABLET | Freq: Four times a day (QID) | ORAL | Status: DC | PRN

## 2014-10-12 NOTE — Discharge Instructions (Signed)

## 2014-10-12 NOTE — Discharge Summary (Signed)
Vaginal Delivery Discharge Summary  Barbara Vargas  DOB:    03/11/1991 MRN:    [id number] CSN:    [id number]  Date of admission:                  10/07/14  Date of discharge:                   10/12/14  Procedures this admission:   SVB  Date of Delivery: 10/10/14  Newborn Data:  Live born female  Birth Weight: 7 lb 1.2 oz (3210 g) APGAR: 8, 9  Home with mother. Name: Avayah   History of Present Illness:  Ms. Barbara Vargas is a 24 y.o. female, G1P1001, who presents at 9225w2d weeks gestation. [facility] She was admitted induction of labor due to postdates. Her pregnancy has been complicated by:  Patient Active Problem List   Diagnosis Date Noted  . Vaginal delivery 10/10/2014  . Anxiety state 10/07/2014  . Seasonal allergies 10/07/2014  . BMI 33.0-33.9,adult 10/07/2014  . Anemia 10/07/2014  . Migraines 07/06/2014  . 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 Negative GBS. Progressed with Cytotech and pitocin over 2 days. Utilized epidural for pain management.  Delivery was performed by Nigel BridgemanVicki Royale Lennartz, CNM, with moderate bleeding after placental delivery--speculum examination of vaginal vault performed, no cervical laceration noted. Patient and baby tolerated the procedure without difficulty, with left vaginal sidewall laceration noted. Infant status was stable and remained in room with mother.  Mother and infant then had an uncomplicated postpartum course, with bottle feeding going well. Mom's physical exam was WNL, and she was discharged home in stable condition. Contraception plan was OCPs, with Sprintec prescribed.  She received adequate benefit from po pain medications, using Percocet and Motrin with benefit.  She had no syncope or dizziness pp.  She did have moderate perineal swelling, with  foley placed for additional 24 hours after delivery, then removed with resumption of spontaneous voiding.   Feeding:  bottle  Contraception:  oral contraceptives (estrogen/progesterone)  Discharge hemoglobin:  HEMOGLOBIN  Date Value Ref Range Status  10/11/2014 9.6* 12.0 - 15.0 g/dL Final  78/29/562112/12/2012 30.813.9 12.2 - 16.2 g/dL Final   HCT  Date Value Ref Range Status  10/11/2014 28.3* 36.0 - 46.0 % Final   HCT, POC  Date Value Ref Range Status  07/11/2013 44.6 37.7 - 47.9 % Final    Discharge Physical Exam:   General: alert Lochia: appropriate Uterine Fundus: firm Incision: healing well, edema of labia minora/majora noted, but improving DVT Evaluation: No evidence of DVT seen on physical exam. Negative Homan's sign.  Intrapartum Procedures: spontaneous vaginal delivery Postpartum Procedures: none Complications-Operative and Postpartum: Left vaginal sidewall laceration  Discharge Diagnoses: Term Pregnancy-delivered  Discharge Information:  Activity:           pelvic rest Diet:                routine Medications: Ibuprofen, Percocet and Sprintec Condition:      stable Instructions:     Discharge to: Home  Follow-up Information    Follow up with [facility] In 6 weeks.   Specialty:  Obstetrics and Gynecology   Why:  Call with any questions or concerns.   Contact information:   [address] [place] [phone]  Nigel Bridgeman CNM 10/12/2014 9:19 AM

## 2014-10-13 NOTE — Progress Notes (Signed)
Post discharge chart review completed.  

## 2016-08-13 IMAGING — US US ABDOMEN COMPLETE
1 series · 14 of 25 positions shown · non-contrast
Comparison: 07/11/2013

CLINICAL DATA: Right upper quadrant pain. Twenty-seven weeks
pregnant.

EXAM:
ULTRASOUND ABDOMEN COMPLETE

[Series 1: us abdomen complete · 0.21mm/px · 14 of 79 slices shown]
[im 1/79]
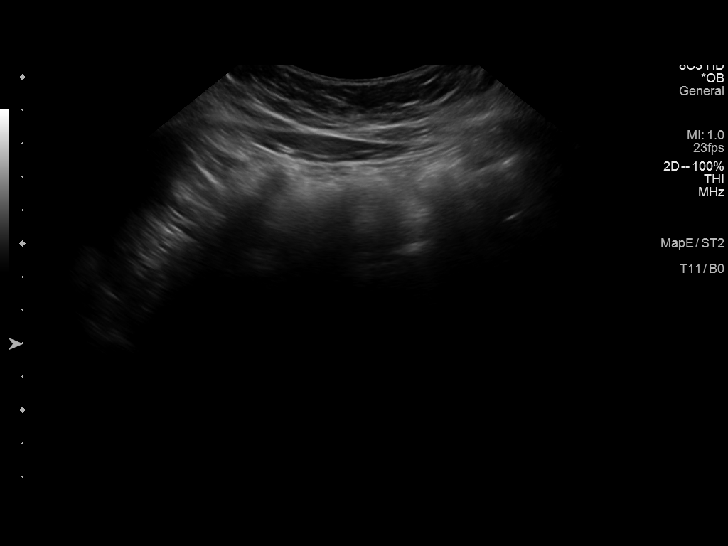
[im 7/79]
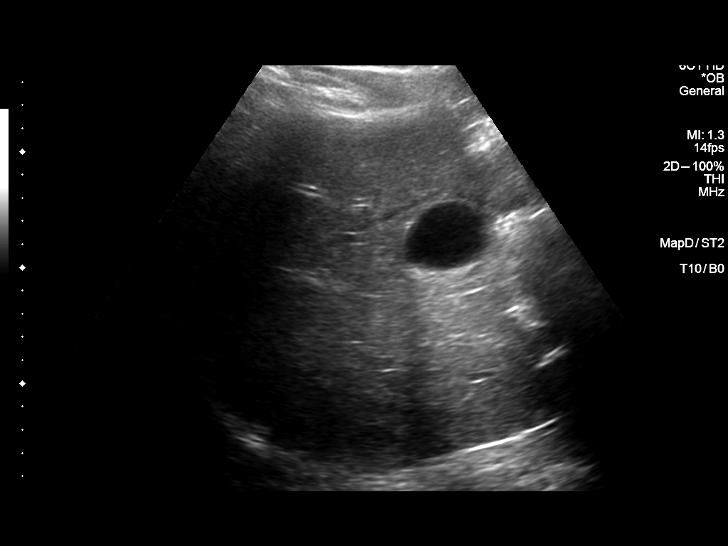
[im 14/79]
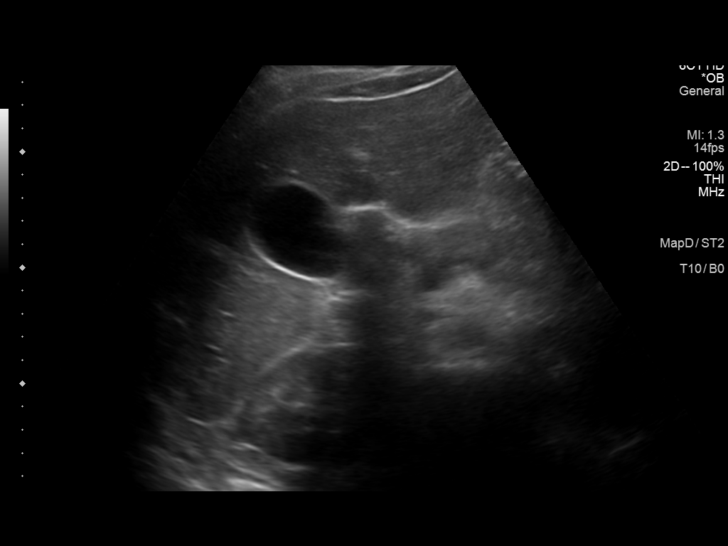
[im 20/79]
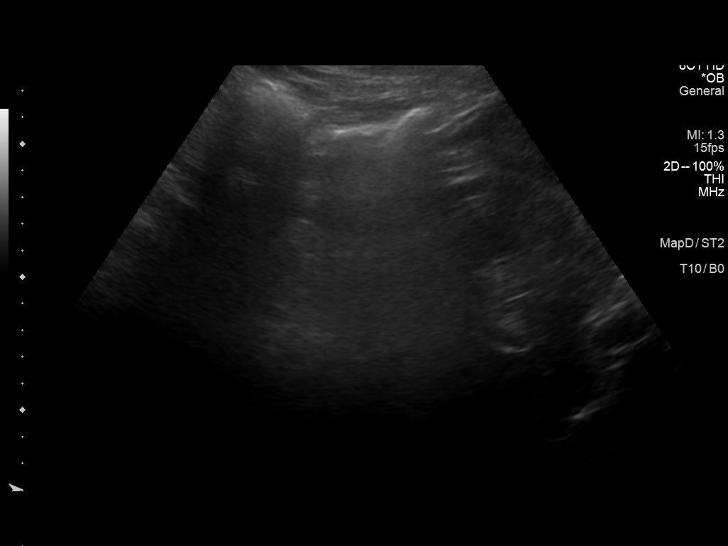
[im 27/79]
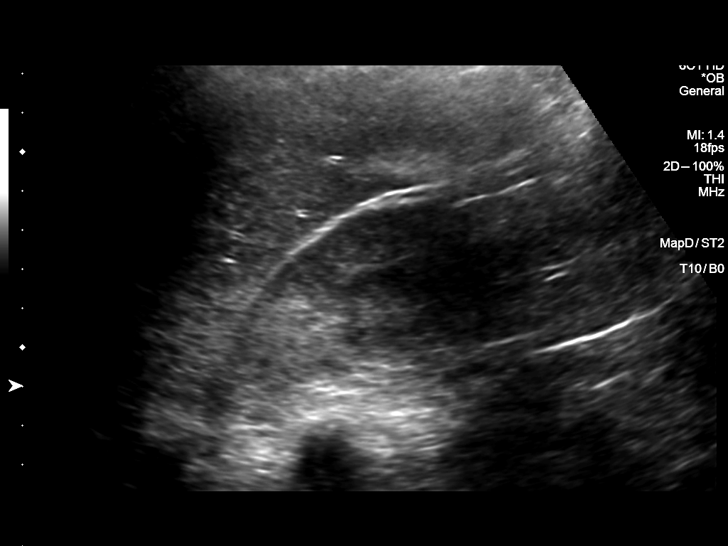
[im 30/79]
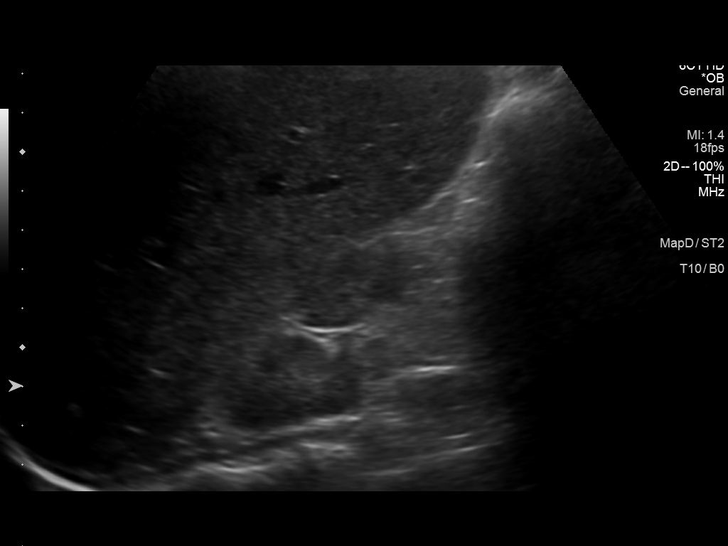
[im 36/79]
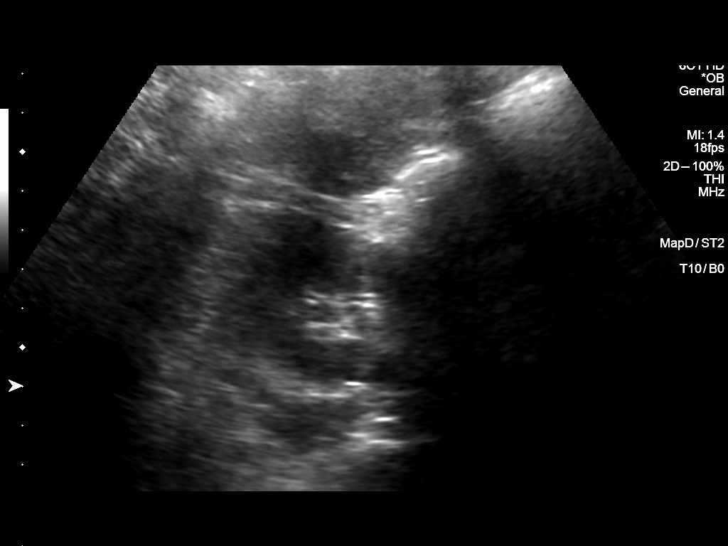
[im 43/79]
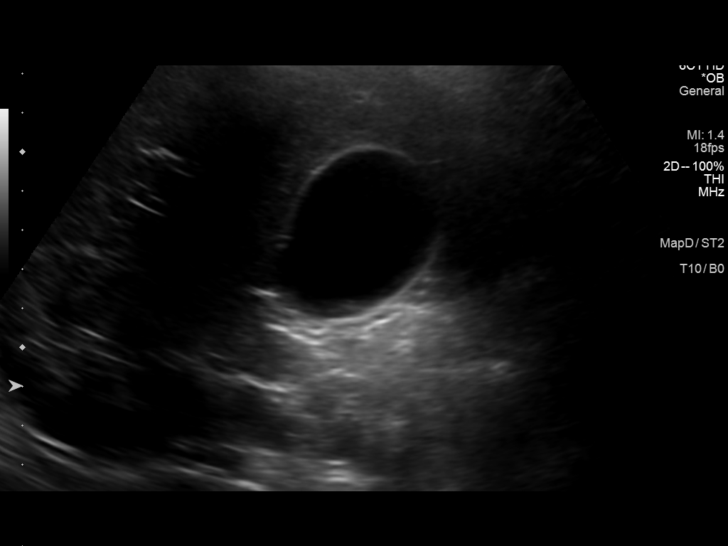
[im 49/79]
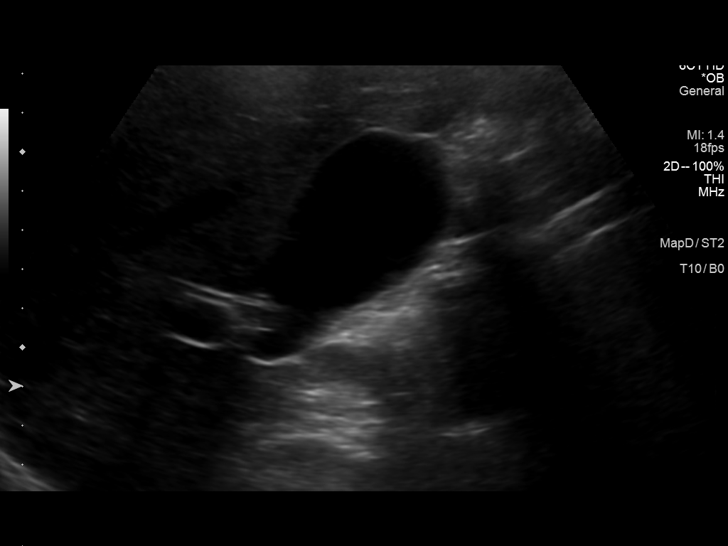
[im 53/79]
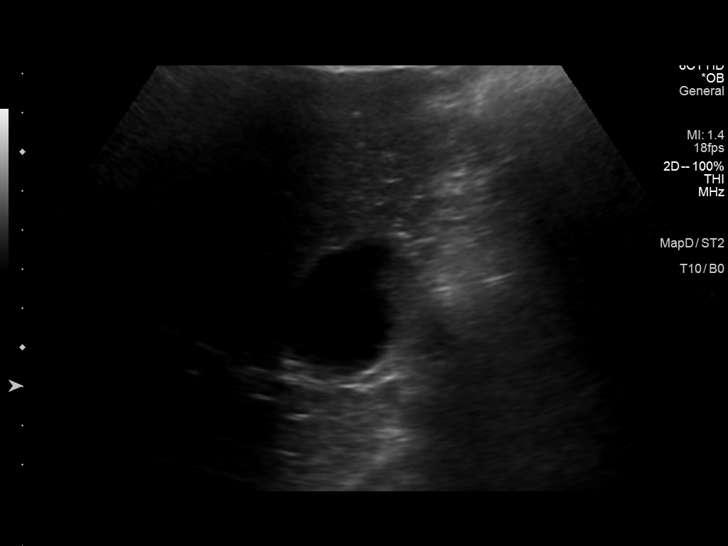
[im 59/79]
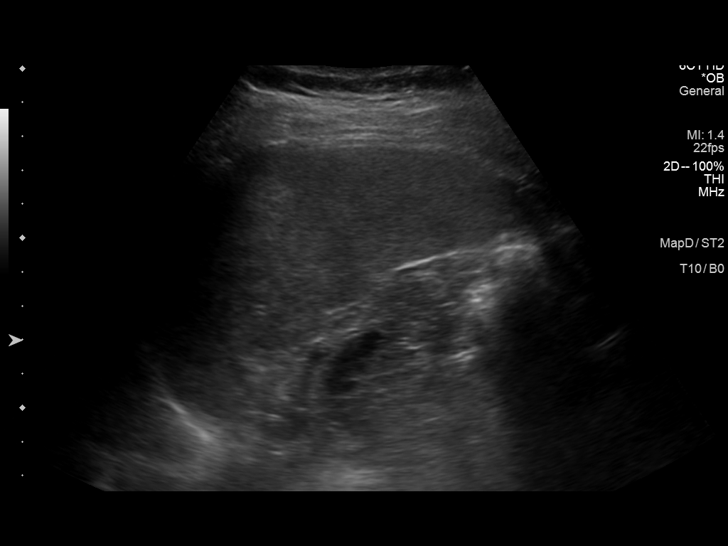
[im 66/79]
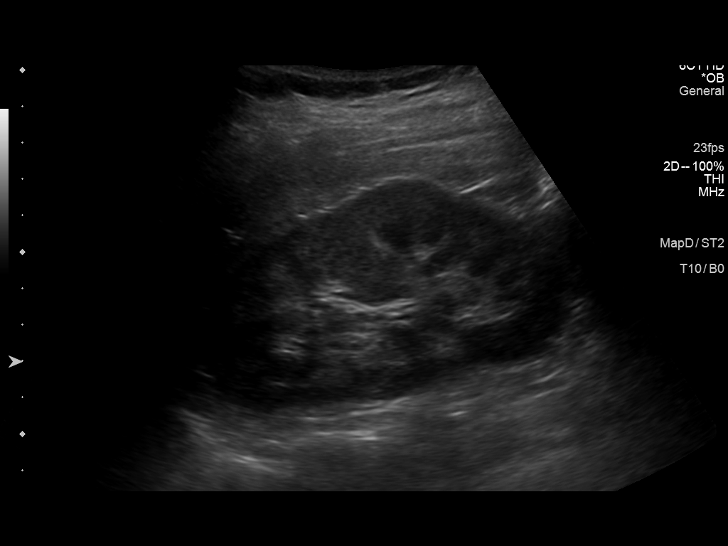
[im 72/79]
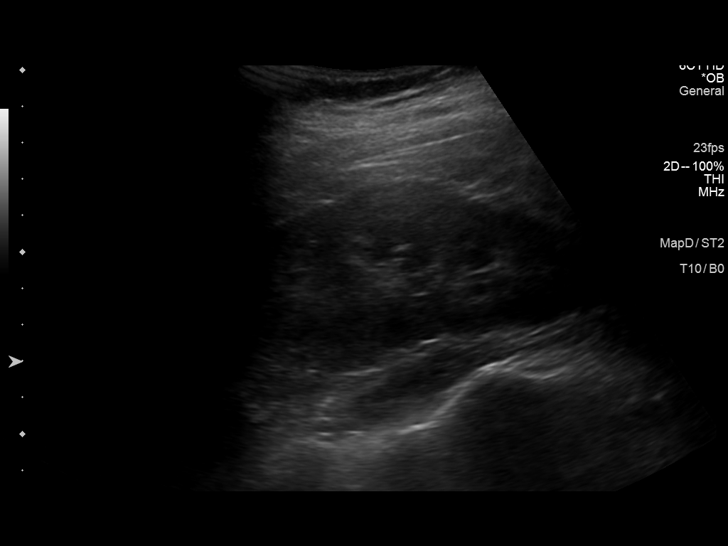
[im 79/79]
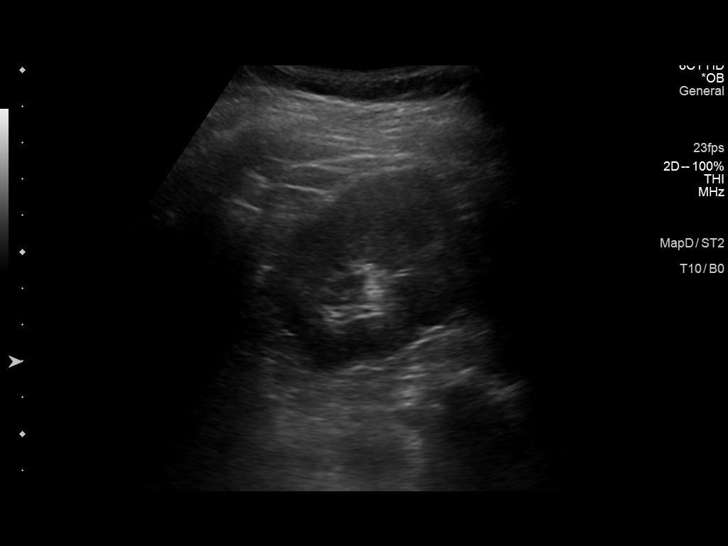

[14 of 25 positions shown; findings below may reference images not displayed]

FINDINGS: Gallbladder: No gallstones or wall thickening visualized. No
sonographic Murphy sign noted.

Common bile duct: Diameter: 0.5 cm

Liver: No focal lesion identified. Within normal limits in
parenchymal echogenicity.

IVC: No abnormality visualized.

Pancreas: Not visualized due to bowel gas.

Spleen: Size and appearance within normal limits.

Right Kidney: Length: 9.7 cm. Normal echogenicity in the right
kidney. Mild fullness of the right renal collecting system without
hydronephrosis.

Left Kidney: Length: 10.4 cm. Echogenicity within normal limits. No
mass or hydronephrosis visualized.

Abdominal aorta: Proximal abdominal aorta is grossly normal. The mid
and distal abdominal aorta are not visualized.

Other findings: None.
IMPRESSION: No acute abnormalities.

Limited evaluation of some retroperitoneal structures.

## 2018-09-30 ENCOUNTER — Other Ambulatory Visit: Payer: Self-pay | Admitting: Medical

## 2018-09-30 DIAGNOSIS — Z124 Encounter for screening for malignant neoplasm of cervix: Secondary | ICD-10-CM

## 2018-09-30 NOTE — Addendum Note (Signed)
Addended by: Lynnell Dike on: 09/30/2018 08:33 PM   Modules accepted: Orders

## 2018-09-30 NOTE — Progress Notes (Signed)
Patient: Barbara Vargas           Date of Birth: 1990/12/15           MRN: [id number] Visit Date: 09/30/2018 PCP: No primary care provider on file.     Cervical Exam  Abnormal Observations: Watery cervical discharge. History recurrent bacterial vaginosis. Exam otherwise normal.  Recommendations: Await Pap results. Follow up as indicated      Patient's History Patient Active Problem List   Diagnosis Date Noted  . Vaginal delivery 10/10/2014  . Anxiety state 10/07/2014  . Seasonal allergies 10/07/2014  . BMI 33.0-33.9,adult 10/07/2014  . Anemia 10/07/2014  . Migraines 07/06/2014  . 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 Allergy   . Anemia   . Anxiety    last time a problem 6 mos ago no meds  . Chicken pox    during childhood  . Herpes simplex    type 2 infection  . Migraine   . Ovarian cyst   . Preterm labor   . Seasonal allergies   . UTI (lower urinary tract infection)     Family History  Problem Relation Age of Onset  . Other Maternal Aunt        malignant tumor of breast  . CVA Paternal Grandfather   . Diabetes type I Paternal Grandfather   . Heart disease Paternal Grandfather   . Diabetes Paternal Grandfather   . Stroke Paternal Grandfather   . Other Maternal Grandmother        malignant tumor of lung  . Other Maternal Grandfather        malignant tumor of colon  . Hirschsprung's disease Other        paternal 1st cousin  . Asthma Sister   . Hearing loss Sister     Past Surgical History:  Procedure Laterality Date  . HERNIA REPAIR     umbilical   Social History   Occupational History  . Not on file  Tobacco Use  . Smoking status: Former Smoker    Packs/day: 0.50    Years: 1.00    Pack years: 0.50    Types: Cigarettes    Last attempt to quit: 01/06/2014    Years since quitting: 4.7  . Smokeless tobacco: Never Used  . Tobacco comment: quit 2014  Substance and Sexual Activity  .  Alcohol use: No  . Drug use: No  . Sexual activity: Not Currently

## 2018-10-02 LAB — CYTOLOGY - PAP: Diagnosis: NEGATIVE

## 2018-11-04 ENCOUNTER — Telehealth (HOSPITAL_COMMUNITY): Payer: Self-pay | Admitting: *Deleted

## 2018-11-04 NOTE — Telephone Encounter (Signed)
Normal Pap smear result letter mailed to patient by Cyt[facility]. 

## 2019-03-14 ENCOUNTER — Other Ambulatory Visit: Payer: Self-pay

## 2019-03-14 ENCOUNTER — Encounter (HOSPITAL_COMMUNITY): Payer: Self-pay

## 2019-03-14 ENCOUNTER — Emergency Department (HOSPITAL_COMMUNITY)
Admission: EM | Admit: 2019-03-14 | Discharge: 2019-03-15 | Disposition: A | Discharge status: Home / Self Care | Payer: Medicaid Other | Attending: Emergency Medicine | Admitting: Emergency Medicine

## 2019-03-14 DIAGNOSIS — R1013 Epigastric pain: Secondary | ICD-10-CM | POA: Diagnosis not present

## 2019-03-14 DIAGNOSIS — Z87891 Personal history of nicotine dependence: Secondary | ICD-10-CM | POA: Diagnosis not present

## 2019-03-14 LAB — COMPREHENSIVE METABOLIC PANEL
ALT: 14 U/L (ref 0–44)
AST: 16 U/L (ref 15–41)
Albumin: 4 g/dL (ref 3.5–5.0)
Alkaline Phosphatase: 42 U/L (ref 38–126)
Anion gap: 9 (ref 5–15)
BUN: 12 mg/dL (ref 6–20)
CO2: 24 mmol/L (ref 22–32)
Calcium: 8.8 mg/dL — ABNORMAL LOW (ref 8.9–10.3)
Chloride: 105 mmol/L (ref 98–111)
Creatinine, Ser: 0.69 mg/dL (ref 0.44–1.00)
GFR calc Af Amer: >60 mL/min (ref >60)
GFR calc non Af Amer: >60 mL/min (ref >60)
Glucose, Bld: 98 mg/dL (ref 70–99)
Potassium: 3.5 mmol/L (ref 3.5–5.1)
Sodium: 138 mmol/L (ref 135–145)
Total Bilirubin: 0.5 mg/dL (ref 0.3–1.2)
Total Protein: 6.8 g/dL (ref 6.5–8.1)

## 2019-03-14 LAB — URINALYSIS, ROUTINE W REFLEX MICROSCOPIC
Bilirubin Urine: NEGATIVE
Glucose, UA: NEGATIVE mg/dL
Hgb urine dipstick: NEGATIVE
Ketones, ur: NEGATIVE mg/dL
Leukocytes,Ua: NEGATIVE
Nitrite: NEGATIVE
Protein, ur: NEGATIVE mg/dL
Specific Gravity, Urine: 1.024 (ref 1.005–1.030)
pH: 6 (ref 5.0–8.0)

## 2019-03-14 LAB — CBC
HCT: 40.8 % (ref 36.0–46.0)
Hemoglobin: 13.3 g/dL (ref 12.0–15.0)
MCH: 29.9 pg (ref 26.0–34.0)
MCHC: 32.6 g/dL (ref 30.0–36.0)
MCV: 91.7 fL (ref 80.0–100.0)
Platelets: 215 10*3/uL (ref 150–400)
RBC: 4.45 MIL/uL (ref 3.87–5.11)
RDW: 11.9 % (ref 11.5–15.5)
WBC: 7.4 10*3/uL (ref 4.0–10.5)
nRBC: 0 % (ref 0.0–0.2)

## 2019-03-14 LAB — LIPASE, BLOOD: Lipase: 23 U/L (ref 11–51)

## 2019-03-14 LAB — I-STAT BETA HCG BLOOD, ED (MC, WL, AP ONLY): I-stat hCG, quantitative: <5 m[IU]/mL (ref <5)

## 2019-03-14 MED ORDER — LIDOCAINE VISCOUS HCL 2 % MT SOLN
15.0000 mL | Freq: Once | OROMUCOSAL | Status: AC
  Administered 2019-03-14: 15 mL via ORAL
  Filled 2019-03-14: qty 15

## 2019-03-14 MED ORDER — SODIUM CHLORIDE 0.9% FLUSH: 3.0000 mL | Freq: Once | INTRAVENOUS | Status: DC

## 2019-03-14 MED ORDER — ALUM & MAG HYDROXIDE-SIMETH 200-200-20 MG/5ML PO SUSP
30.0000 mL | Freq: Once | ORAL | Status: AC
  Administered 2019-03-14: 30 mL via ORAL
  Filled 2019-03-14: qty 30

## 2019-03-14 NOTE — ED Triage Notes (Signed)
Pt reports epigastric abdominal pain that started shortly after she woke up. States that she had a soft BM this morning, but no diarrhea. Denies vomiting or urinary complaints. States that the pain comes and goes and is an 8 at its worst. A&Ox4.

## 2019-03-14 NOTE — ED Notes (Signed)
Urine culture sent to the lab. 

## 2019-03-15 NOTE — Discharge Instructions (Signed)
Use OTC Maalox, Tums, Rolaids first. If this doesn't work, you can try Pepcid or Prilosec.

## 2019-03-15 NOTE — ED Provider Notes (Signed)
[facility] DEPT Provider Note  CSN: [id number] Arrival date & time: 03/14/19 2132  Chief Complaint(s) Abdominal Pain  HPI Barbara Vargas is a 28 y.o. female   The history is provided by the patient.  Abdominal Pain Pain location:  Epigastric Pain quality: burning   Pain severity:  Moderate Onset quality:  Gradual Duration:  1 day Timing:  Intermittent Progression:  Waxing and waning Chronicity:  New Context: eating   Relieved by: sitting up. Exacerbated by: lying down. Ineffective treatments:  Antacids Associated symptoms: no chest pain, no constipation, no fever, no nausea, no shortness of breath and no vomiting    Reports that she has been eating healthy diet recently but a an unhealthy meal last night.  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 Authorizing Provider  Aspirin-Acetaminophen-Caffeine (GOODYS EXTRA STRENGTH) [id number] MG PACK Take 1 Package by mouth 3 (three) times daily as needed (pain).   Yes [provider]  aspirin-sod bicarb-citric acid (ALKA-SELTZER) 325 MG TBEF tablet Take 325 mg by mouth every 6 (six) hours as needed (pain).   Yes [provider]  simethicone (MYLICON) 381 MG chewable tablet Chew 125 mg by mouth every 6 (six) hours as needed for  flatulence.   Yes [provider]  ibuprofen (ADVIL,MOTRIN) 600 MG tablet Take 1 tablet (600 mg total) by mouth every 6 (six) hours as needed. Patient not taking: Reported on 03/14/2019 10/12/14   Donnel Saxon, CNM  norgestimate-ethinyl estradiol (ORTHO-CYCLEN,SPRINTEC,PREVIFEM) 0.25-35 MG-MCG tablet Take 1 tablet by mouth daily. Patient not taking: Reported on 03/14/2019 10/31/14   Donnel Saxon, CNM  oxyCODONE-acetaminophen (PERCOCET/ROXICET) 5-325 MG per tablet Take 1 tablet by mouth every 4 (four) hours as needed (for pain scale less than 7). Patient not taking: Reported on 03/14/2019 10/12/14   Donnel Saxon, CNM                                                                                                                                    Past Surgical History Past Surgical History:  Procedure Laterality Date   HERNIA REPAIR     umbilical   Family History Family History  Problem Relation Age of Onset   Other Maternal Aunt        malignant tumor of breast   CVA Paternal Grandfather    Diabetes type I Paternal Grandfather    Heart disease Paternal Grandfather    Diabetes Paternal Grandfather    Stroke Paternal Grandfather    Other Maternal Grandmother        malignant tumor of lung   Other Maternal Grandfather        malignant tumor of colon   Hirschsprung's disease Other        paternal 1st cousin   Asthma Sister    Hearing loss Sister     Social History Social History   Tobacco Use   Smoking status: Former Smoker    Packs/day: 0.50    Years: 1.00    Pack years: 0.50    Types: Cigarettes    Quit date: 01/06/2014    Years since quitting: 5.1   Smokeless tobacco: Never Used   Tobacco comment: quit 2014  Substance Use Topics   Alcohol use: No   Drug use: No   Allergies Patient has no known allergies.  Review of Systems Review of Systems  Constitutional: Negative for fever.  Respiratory: Negative for shortness of breath.   Cardiovascular:  Negative for chest pain.  Gastrointestinal: Positive for abdominal pain. Negative for constipation, nausea and vomiting.   All other systems are reviewed and are negative for acute change except as noted in the HPI  Physical Exam Vital Signs  I have reviewed the triage vital signs BP 115/77    Pulse 64    Temp 98.5 F (36.9 C) (Oral)    Resp 16    Ht 5\' 3"  (1.6 m)    Wt 77.1 kg    SpO2 98%    BMI 30.11 kg/m   Physical Exam Vitals signs reviewed.  Constitutional:      General: She is not in acute distress.    Appearance: She is well-developed. She is not diaphoretic.  HENT:     Head: Normocephalic and atraumatic.     Right Ear: External ear normal.     Left Ear: External ear normal.     Nose: Nose normal.  Eyes:     General: No scleral icterus.    Conjunctiva/sclera: Conjunctivae normal.  Neck:     Musculoskeletal: Normal range of motion.     Trachea: Phonation normal.  Cardiovascular:     Rate and Rhythm: Normal rate and regular rhythm.  Pulmonary:     Effort: Pulmonary effort is normal. No respiratory distress.     Breath sounds: No stridor.  Abdominal:     General: There is no distension.     Tenderness: There is abdominal tenderness (mild discomfort) in the epigastric area.  Musculoskeletal: Normal range of motion.  Neurological:     Mental Status: She is alert and oriented to person, place, and time.  Psychiatric:        Behavior: Behavior normal.     ED Results and Treatments Labs (all labs ordered are listed, but only abnormal results are displayed) Labs Reviewed  COMPREHENSIVE METABOLIC PANEL - Abnormal; Notable for the following components:      Result Value   Calcium 8.8 (*)    All other components within normal limits  URINALYSIS, ROUTINE W REFLEX MICROSCOPIC - Abnormal; Notable for the following components:   APPearance HAZY (*)    All other components within normal limits  LIPASE, BLOOD  CBC  I-STAT BETA HCG BLOOD, ED ([facility], [facility], [facility] ONLY)                                                                                                                          EKG  EKG Interpretation  Date/Time:    Ventricular Rate:    PR Interval:    QRS Duration:   QT Interval:    QTC Calculation:   R Axis:     Text Interpretation:        Radiology No results found.  Pertinent labs & imaging results that were available during my care of the patient were reviewed by me and considered in my medical decision making (see chart for details).  Medications Ordered in ED Medications  sodium chloride flush (NS) 0.9 % injection 3 mL (0 mLs Intravenous Hold 03/14/19 2343)  alum & mag hydroxide-simeth (MAALOX/MYLANTA) 200-200-20 MG/5ML suspension 30 mL (30 mLs Oral Given 03/14/19 2350)    And  lidocaine (XYLOCAINE) 2 % viscous mouth solution 15 mL (15 mLs Oral Given 03/14/19 2350)                                                                                                                                    Procedures Procedures  (including critical care time)  Medical Decision Making / ED Course I have reviewed the nursing notes for this encounter and the patient's prior records (if available in EHR or on provided paperwork).   Marcello FennelHannah Clearman was evaluated in Emergency Department on 03/15/2019 for the symptoms described in the history of present illness. She was evaluated in the context of the global COVID-19 pandemic, which necessitated consideration that the patient might be at risk for infection with the SARS-CoV-2 virus that causes COVID-19. Institutional protocols and algorithms that pertain to the evaluation of patients at risk for COVID-19 are in a state of rapid change based on information released by regulatory bodies including the CDC and federal and state organizations. These policies and algorithms were followed during the patient's care in the ED.  Patient presents with epigastric abdominal burning. She is afebrile with stable vital signs.   Well-appearing and well-hydrated. Abdomen with mild epigastric discomfort without signs of peritonitis. Labs grossly reassuring without leukocytosis, anemia. Beta-hCG negative. No evidence of electrolyte derangements or renal sufficiency. No evidence of biliary obstruction or pancreatitis.  Most suspicious for acid reflux.  Provided with GI cocktail.  Low suspicion for serious intra-abdominal Fama to assess infectious process requiring further imaging at this time.  The patient is safe for discharge with strict return precautions.       Final Clinical Impression(s) / ED Diagnoses Final diagnoses:  Epigastric burning sensation     The patient appears reasonably screened and/or stabilized for discharge and I doubt any other medical condition or other [facility] requiring further screening, evaluation, or treatment in the ED at this time prior to discharge.  Disposition: Discharge  Condition: Good  I have discussed the results, Dx and Tx plan with the patient who expressed understanding and agree(s) with the plan. Discharge instructions discussed at great length. The patient was given strict return precautions who verbalized understanding of the instructions. No further questions at time of discharge.    ED Discharge Orders    None      This chart was dictated using voice recognition software.  Despite best efforts to proofread,  errors can occur which can change the documentation meaning.   Nira Connardama, Jurnei Latini Eduardo, MD 03/15/19 [phone]

## 2019-03-17 ENCOUNTER — Emergency Department (HOSPITAL_COMMUNITY)
Admission: EM | Admit: 2019-03-17 | Discharge: 2019-03-17 | Disposition: A | Discharge status: Home / Self Care | Payer: Medicaid Other | Attending: Emergency Medicine | Admitting: Emergency Medicine

## 2019-03-17 ENCOUNTER — Other Ambulatory Visit: Payer: Self-pay

## 2019-03-17 ENCOUNTER — Encounter (HOSPITAL_COMMUNITY): Payer: Self-pay

## 2019-03-17 DIAGNOSIS — R1013 Epigastric pain: Secondary | ICD-10-CM

## 2019-03-17 DIAGNOSIS — Z79899 Other long term (current) drug therapy: Secondary | ICD-10-CM | POA: Insufficient documentation

## 2019-03-17 DIAGNOSIS — Z87891 Personal history of nicotine dependence: Secondary | ICD-10-CM | POA: Insufficient documentation

## 2019-03-17 DIAGNOSIS — K625 Hemorrhage of anus and rectum: Secondary | ICD-10-CM | POA: Diagnosis present

## 2019-03-17 LAB — CBC WITH DIFFERENTIAL/PLATELET
Abs Immature Granulocytes: 0.06 10*3/uL (ref 0.00–0.07)
Basophils Absolute: 0 10*3/uL (ref 0.0–0.1)
Basophils Relative: 0 %
Eosinophils Absolute: 0.1 10*3/uL (ref 0.0–0.5)
Eosinophils Relative: 2 %
HCT: 42.9 % (ref 36.0–46.0)
Hemoglobin: 14.1 g/dL (ref 12.0–15.0)
Immature Granulocytes: 1 %
Lymphocytes Relative: 16 %
Lymphs Abs: 1.4 10*3/uL (ref 0.7–4.0)
MCH: 30.2 pg (ref 26.0–34.0)
MCHC: 32.9 g/dL (ref 30.0–36.0)
MCV: 91.9 fL (ref 80.0–100.0)
Monocytes Absolute: 0.4 10*3/uL (ref 0.1–1.0)
Monocytes Relative: 4 %
Neutro Abs: 6.6 10*3/uL (ref 1.7–7.7)
Neutrophils Relative %: 77 %
Platelets: 202 10*3/uL (ref 150–400)
RBC: 4.67 MIL/uL (ref 3.87–5.11)
RDW: 11.9 % (ref 11.5–15.5)
WBC: 8.6 10*3/uL (ref 4.0–10.5)
nRBC: 0 % (ref 0.0–0.2)

## 2019-03-17 LAB — COMPREHENSIVE METABOLIC PANEL
ALT: 13 U/L (ref 0–44)
AST: 15 U/L (ref 15–41)
Albumin: 4.7 g/dL (ref 3.5–5.0)
Alkaline Phosphatase: 44 U/L (ref 38–126)
Anion gap: 9 (ref 5–15)
BUN: 10 mg/dL (ref 6–20)
CO2: 25 mmol/L (ref 22–32)
Calcium: 9.5 mg/dL (ref 8.9–10.3)
Chloride: 106 mmol/L (ref 98–111)
Creatinine, Ser: 0.69 mg/dL (ref 0.44–1.00)
GFR calc Af Amer: >60 mL/min (ref >60)
GFR calc non Af Amer: >60 mL/min (ref >60)
Glucose, Bld: 101 mg/dL — ABNORMAL HIGH (ref 70–99)
Potassium: 3.5 mmol/L (ref 3.5–5.1)
Sodium: 140 mmol/L (ref 135–145)
Total Bilirubin: 0.7 mg/dL (ref 0.3–1.2)
Total Protein: 7.5 g/dL (ref 6.5–8.1)

## 2019-03-17 LAB — POC OCCULT BLOOD, ED: Fecal Occult Bld: NEGATIVE

## 2019-03-17 MED ORDER — SUCRALFATE 1 G PO TABS
1.0000 g | ORAL_TABLET | Freq: Once | ORAL | Status: AC
Start: 2019-03-17 — End: 2019-03-17
  Administered 2019-03-17: 1 g via ORAL
  Filled 2019-03-17: qty 1

## 2019-03-17 MED ORDER — PANTOPRAZOLE SODIUM 40 MG PO TBEC
40.0000 mg | DELAYED_RELEASE_TABLET | Freq: Once | ORAL | Status: AC
  Administered 2019-03-17: 40 mg via ORAL
  Filled 2019-03-17: qty 1

## 2019-03-17 MED ORDER — ALUM & MAG HYDROXIDE-SIMETH 200-200-20 MG/5ML PO SUSP
30.0000 mL | Freq: Once | ORAL | Status: AC
  Administered 2019-03-17: 30 mL via ORAL
  Filled 2019-03-17: qty 30

## 2019-03-17 MED ORDER — ONDANSETRON 4 MG PO TBDP
4.0000 mg | ORAL_TABLET | Freq: Once | ORAL | Status: AC
  Administered 2019-03-17: 4 mg via ORAL
  Filled 2019-03-17: qty 1

## 2019-03-17 MED ORDER — SUCRALFATE 1 G PO TABS: 1.0000 g | ORAL_TABLET | Freq: Three times a day (TID) | ORAL | 0 refills | Status: AC

## 2019-03-17 MED ORDER — LIDOCAINE VISCOUS HCL 2 % MT SOLN
15.0000 mL | Freq: Once | OROMUCOSAL | Status: AC
  Administered 2019-03-17: 15 mL via ORAL
  Filled 2019-03-17: qty 15

## 2019-03-17 MED ORDER — FAMOTIDINE 20 MG PO TABS
20.0000 mg | ORAL_TABLET | Freq: Once | ORAL | Status: AC
  Administered 2019-03-17: 20 mg via ORAL
  Filled 2019-03-17: qty 1

## 2019-03-17 NOTE — ED Provider Notes (Signed)
[facility] DEPT Provider Note   CSN: [id number] Arrival date & time: 03/17/19  4098    History   Chief Complaint Chief Complaint  Patient presents with  . Rectal Bleeding    HPI Barbara Vargas is a 28 y.o. female.     The history is provided by the patient and medical records. No language interpreter was used.  Rectal Bleeding Associated symptoms: abdominal pain   Associated symptoms: no vomiting    Barbara Vargas is a 28 y.o. female  with a PMH as listed below who presents to the Emergency Department complaining of persistent upper abdominal discomfort.  Described as a burning sensation.  Associated with nausea, but no vomiting.  Seen in the emergency department on 8/07 for similar.  She was told to take omeprazole over-the-counter, but does not feel as if that is helping.  This morning around 2 AM, her symptoms worsened.  She awoke in the middle of the night with a burning pain.  This has improved somewhat, but still uncomfortable to her.  She went to the restroom today and noticed bright red specks of blood in her stool.  She does report feeling constipated the week prior.  She has been having bowel movements, but 1 or 2 less this week than usual.  Denies any fever or chills.   Past Medical History:  Diagnosis Date  . Allergy   . Anemia   . Anxiety    last time a problem 6 mos ago no meds  . Chicken pox    during childhood  . Herpes simplex    type 2 infection  . Migraine   . Ovarian cyst   . Preterm labor   . Seasonal allergies   . UTI (lower urinary tract infection)     Patient Active Problem List   Diagnosis Date Noted  . Vaginal delivery 10/10/2014  . Anxiety state 10/07/2014  . Seasonal allergies 10/07/2014  . BMI 33.0-33.9,adult 10/07/2014  . Anemia 10/07/2014  . Migraines 07/06/2014  . 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 HERNIA REPAIR     umbilical     OB History    Gravida  1   Para  1   Term  1   Preterm      AB      Living  1     SAB      TAB      Ectopic      Multiple  0   Live Births  1            Home Medications    Prior to Admission medications   Medication Sig Start Date End Date Taking? Authorizing Provider  Aspirin-Acetaminophen-Caffeine (GOODYS EXTRA STRENGTH) [id number] MG PACK Take 1 Package by mouth 3 (three) times daily as needed (pain).   Yes [provider]  calcium carbonate (TUMS - DOSED IN MG ELEMENTAL CALCIUM) 500 MG chewable tablet Chew 1 tablet by mouth daily as needed for indigestion or heartburn.   Yes [provider]  omeprazole (PRILOSEC) 20 MG capsule Take 20 mg by mouth daily.   Yes [provider]  norgestimate-ethinyl estradiol (ORTHO-CYCLEN,SPRINTEC,PREVIFEM) 0.25-35 MG-MCG tablet Take 1 tablet by mouth daily. Patient not taking: Reported on 03/14/2019 10/31/14   Donnel Saxon, CNM  oxyCODONE-acetaminophen (PERCOCET/ROXICET) 5-325 MG per tablet Take 1 tablet by mouth every 4 (four) hours as needed (for pain scale less than 7). Patient not taking:  Reported on 03/14/2019 10/12/14   Nigel BridgemanLatham, Vicki, CNM  sucralfate (CARAFATE) 1 g tablet Take 1 tablet (1 g total) by mouth 4 (four) times daily -  with meals and at bedtime. 03/17/19   Lianny Molter, Chase PicketJaime Pilcher, PA-C    Family History Family History  Problem Relation Age of Onset  . Other Maternal Aunt        malignant tumor of breast  . CVA Paternal Grandfather   . Diabetes type I Paternal Grandfather   . Heart disease Paternal Grandfather   . Diabetes Paternal Grandfather   . Stroke Paternal Grandfather   . Other Maternal Grandmother        malignant tumor of lung  . Other Maternal Grandfather        malignant tumor of colon  . Hirschsprung's disease Other        paternal 1st cousin  . Asthma Sister   . Hearing loss Sister     Social History Social History   Tobacco Use  . Smoking status:  Former Smoker    Packs/day: 0.50    Years: 1.00    Pack years: 0.50    Types: Cigarettes    Quit date: 01/06/2014    Years since quitting: 5.1  . Smokeless tobacco: Never Used  . Tobacco comment: quit 2014  Substance Use Topics  . Alcohol use: No  . Drug use: No     Allergies   Patient has no known allergies.   Review of Systems Review of Systems  Gastrointestinal: Positive for abdominal pain, blood in stool, hematochezia and nausea. Negative for anal bleeding, constipation, diarrhea, rectal pain and vomiting.  All other systems reviewed and are negative.    Physical Exam Updated Vital Signs BP (!) 109/47 (BP Location: Right Arm)   Pulse 65   Temp 98.6 F (37 C) (Oral)   Resp 18   Ht 5\' 5"  (1.651 m)   Wt 77.1 kg   SpO2 100%   BMI 28.29 kg/m   Physical Exam Vitals signs and nursing note reviewed.  Constitutional:      General: She is not in acute distress.    Appearance: She is well-developed.     Comments: Nontoxic-appearing.  HENT:     Head: Normocephalic and atraumatic.  Neck:     Musculoskeletal: Neck supple.  Cardiovascular:     Rate and Rhythm: Normal rate and regular rhythm.     Heart sounds: Normal heart sounds. No murmur.  Pulmonary:     Effort: Pulmonary effort is normal. No respiratory distress.     Breath sounds: Normal breath sounds. No wheezing, rhonchi or rales.  Abdominal:     General: There is no distension.     Palpations: Abdomen is soft.     Comments: Mild epigastric and left upper quadrant tenderness without rebound or guarding.  Negative Murphy's.  No flank or CVA tenderness.  Skin:    General: Skin is warm and dry.  Neurological:     Mental Status: She is alert and oriented to person, place, and time.      ED Treatments / Results  Labs (all labs ordered are listed, but only abnormal results are displayed) Labs Reviewed  COMPREHENSIVE METABOLIC PANEL - Abnormal; Notable for the following components:      Result Value    Glucose, Bld 101 (*)    All other components within normal limits  CBC WITH DIFFERENTIAL/PLATELET  POC OCCULT BLOOD, ED    EKG None  Radiology No results found.  Procedures Procedures (including critical care time)  Medications Ordered in ED Medications  sucralfate (CARAFATE) tablet 1 g (1 g Oral Given 03/17/19 0825)  ondansetron (ZOFRAN-ODT) disintegrating tablet 4 mg (4 mg Oral Given 03/17/19 0825)  alum & mag hydroxide-simeth (MAALOX/MYLANTA) 200-200-20 MG/5ML suspension 30 mL (30 mLs Oral Given 03/17/19 1004)    And  lidocaine (XYLOCAINE) 2 % viscous mouth solution 15 mL (15 mLs Oral Given 03/17/19 1004)  famotidine (PEPCID) tablet 20 mg (20 mg Oral Given 03/17/19 1004)  pantoprazole (PROTONIX) EC tablet 40 mg (40 mg Oral Given 03/17/19 1004)     Initial Impression / Assessment and Plan / ED Course  I have reviewed the triage vital signs and the nursing notes.  Pertinent labs & imaging results that were available during my care of the patient were reviewed by me and considered in my medical decision making (see chart for details).       Marcello FennelHannah Baugher is a 28 y.o. female who presents to ED for persistent epigastric and left upper quadrant burning pain for the last couple of days. On exam, patient is afebrile hemodynamically stable.  No peritoneal signs.  Negative Murphy's. Mostly tender to left upper quadrant, a little to the epigastrium.  Seen in emergency department on 8/07 for same.  Chart reviewed from that encounter.  Labs reassuring at that visit as well.  Repeat labs today look good too. Feels better after Carafate in ED. Will give rx for this as well. Doubt gallbladder pathology. Do not feel imaging is needed at this time. Evaluation does not show pathology that would require ongoing emergent intervention or inpatient treatment. Will continue omeprazole, add pepcid / carafate and refer to GI. Reasons to return to ER and plan of care discussed and all questions answered.    Final Clinical Impressions(s) / ED Diagnoses   Final diagnoses:  Epigastric pain    ED Discharge Orders         Ordered    sucralfate (CARAFATE) 1 g tablet  3 times daily with meals & bedtime     03/17/19 0942           Hunner Garcon, Chase PicketJaime Pilcher, PA-C 03/17/19 1040    Bethann BerkshireZammit, Joseph, MD 03/18/19 [phone]

## 2019-03-17 NOTE — ED Triage Notes (Signed)
Pt reports continued epigastric abdominal pain from yesterday when she was seen here. Denies vomiting, but had a BM with "specks of red blood in it." Pain is 8/10 at this time.

## 2019-03-17 NOTE — ED Notes (Signed)
Discharge instructions reviewed with patient. Patient verbalizes understanding. VSS.   

## 2019-03-17 NOTE — Discharge Instructions (Signed)
It was my pleasure taking care of you today!   Continue to take Omeprazole.  Start taking Carafate as directed for pain. You can also take Pepcid over the counter to help as well.   Call the GI clinic listed today to schedule a follow up appointment. As we discussed, I also included the primary care clinic information for you.   Return to ER for fever, vomiting, new or worsening symptoms, any additional concerns.

## 2019-03-21 ENCOUNTER — Other Ambulatory Visit: Payer: Self-pay | Admitting: Gastroenterology

## 2019-03-21 DIAGNOSIS — R1011 Right upper quadrant pain: Secondary | ICD-10-CM

## 2019-03-27 ENCOUNTER — Other Ambulatory Visit: Payer: Self-pay | Admitting: Gastroenterology

## 2019-03-27 DIAGNOSIS — R1011 Right upper quadrant pain: Secondary | ICD-10-CM

## 2019-03-31 ENCOUNTER — Other Ambulatory Visit: Payer: Self-pay
